# Patient Record
Sex: Female | Born: 1940 | Race: White | Hispanic: No | Marital: Married | Smoking: Former smoker
Health system: Southern US, Community
[De-identification: ages and names within clinical notes are randomized; demographics above are authoritative.]

## PROBLEM LIST (undated history)

## (undated) DIAGNOSIS — E079 Disorder of thyroid, unspecified: Secondary | ICD-10-CM

## (undated) DIAGNOSIS — D219 Benign neoplasm of connective and other soft tissue, unspecified: Secondary | ICD-10-CM

## (undated) DIAGNOSIS — I10 Essential (primary) hypertension: Secondary | ICD-10-CM

## (undated) HISTORY — PX: TONSILLECTOMY: SHX5217

## (undated) HISTORY — DX: Disorder of thyroid, unspecified: E07.9

## (undated) HISTORY — DX: Essential (primary) hypertension: I10

## (undated) HISTORY — DX: Benign neoplasm of connective and other soft tissue, unspecified: D21.9

## (undated) HISTORY — PX: HIP ARTHROPLASTY: SHX981

---

## 1999-06-29 ENCOUNTER — Other Ambulatory Visit: Admission: RE | Admit: 1999-06-29 | Discharge: 1999-06-29 | Payer: Self-pay | Admitting: Cardiology

## 2000-06-29 ENCOUNTER — Other Ambulatory Visit: Admission: RE | Admit: 2000-06-29 | Discharge: 2000-06-29 | Payer: Self-pay | Admitting: Internal Medicine

## 2001-02-28 ENCOUNTER — Encounter: Payer: Self-pay | Admitting: Orthopedic Surgery

## 2001-03-05 ENCOUNTER — Inpatient Hospital Stay (HOSPITAL_COMMUNITY): Admission: RE | Admit: 2001-03-05 | Discharge: 2001-03-07 | Payer: Self-pay | Admitting: Orthopedic Surgery

## 2001-11-23 ENCOUNTER — Other Ambulatory Visit: Admission: RE | Admit: 2001-11-23 | Discharge: 2001-11-23 | Payer: Self-pay | Admitting: Obstetrics and Gynecology

## 2003-01-10 ENCOUNTER — Other Ambulatory Visit: Admission: RE | Admit: 2003-01-10 | Discharge: 2003-01-10 | Payer: Self-pay | Admitting: Obstetrics and Gynecology

## 2004-01-15 ENCOUNTER — Other Ambulatory Visit: Admission: RE | Admit: 2004-01-15 | Discharge: 2004-01-15 | Payer: Self-pay | Admitting: *Deleted

## 2005-02-09 ENCOUNTER — Other Ambulatory Visit: Admission: RE | Admit: 2005-02-09 | Discharge: 2005-02-09 | Payer: Self-pay | Admitting: *Deleted

## 2005-08-09 ENCOUNTER — Encounter: Admission: RE | Admit: 2005-08-09 | Discharge: 2005-08-09 | Payer: Self-pay | Admitting: Orthopedic Surgery

## 2006-01-04 ENCOUNTER — Ambulatory Visit (HOSPITAL_COMMUNITY): Admission: RE | Admit: 2006-01-04 | Discharge: 2006-01-04 | Payer: Self-pay | Admitting: Family Medicine

## 2006-01-04 ENCOUNTER — Encounter: Payer: Self-pay | Admitting: Vascular Surgery

## 2006-04-17 ENCOUNTER — Other Ambulatory Visit: Admission: RE | Admit: 2006-04-17 | Discharge: 2006-04-17 | Payer: Self-pay | Admitting: Obstetrics & Gynecology

## 2006-09-04 ENCOUNTER — Inpatient Hospital Stay (HOSPITAL_COMMUNITY): Admission: RE | Admit: 2006-09-04 | Discharge: 2006-09-06 | Payer: Self-pay | Admitting: Orthopedic Surgery

## 2007-03-27 ENCOUNTER — Encounter: Payer: Self-pay | Admitting: Gastroenterology

## 2007-07-27 ENCOUNTER — Other Ambulatory Visit: Admission: RE | Admit: 2007-07-27 | Discharge: 2007-07-27 | Payer: Self-pay | Admitting: Obstetrics and Gynecology

## 2007-08-09 ENCOUNTER — Ambulatory Visit: Payer: Self-pay | Admitting: Internal Medicine

## 2007-08-23 ENCOUNTER — Ambulatory Visit: Payer: Self-pay | Admitting: Internal Medicine

## 2007-10-21 ENCOUNTER — Encounter: Admission: RE | Admit: 2007-10-21 | Discharge: 2007-10-21 | Payer: Self-pay | Admitting: Orthopedic Surgery

## 2010-08-19 NOTE — Procedures (Signed)
Summary: Gastroenterology EGD  Gastroenterology EGD   Imported By: Lowry Ram CMA 09/28/2007 13:36:31  _____________________________________________________________________  External Attachment:    Type:   Image     Comment:   External Document

## 2010-08-19 NOTE — Procedures (Signed)
Summary: Gastroenterology endo  Gastroenterology endo   Imported By: Donneta Romberg 09/28/2007 12:59:13  _____________________________________________________________________  External Attachment:    Type:   Image     Comment:   External Document

## 2010-12-03 NOTE — Discharge Summary (Signed)
Lauren Krueger, Lauren Krueger                ACCOUNT NO.:  [id number]   MEDICAL RECORD NO.:  [id number]          PATIENT TYPE:  INP   LOCATION:  5007                         FACILITY:  [facility]   PHYSICIAN:  Erskine Squibb B. Su Hilt, P.A.  DATE OF BIRTH:  1941/04/11   DATE OF ADMISSION:  09/04/2006  DATE OF DISCHARGE:  09/06/2006                               DISCHARGE SUMMARY   PRIMARY DIAGNOSIS:  End-stage DJD of the right hip.   PROCEDURE WHILE IN THE HOSPITAL:  Right total hip arthroplasty.   DISCHARGE SUMMARY:  Patient is a 70 year old woman who had a left total  hip done by Dr. Turner Daniels in 2002.  She has now developed end-stage  arthritis of the right hip.  Conservative treatment has consisted of  observation, antiinflammatory medication, physical therapy and a  diagnostic and therapeutic injection of cortisone into the hip that  provided excellent temporary relief and then the pain returned  immediately.  She also has had an MRI showing a large bursa on the  iliopsoas muscle.  Plain radiograph showed bone-on-bone arthritic  changes.  Risks and benefits of the surgery were discussed.  Questions  answered and she wishes to proceed with total hip arthroplasty on the  right.   ALLERGIES:  NO KNOWN DRUG ALLERGIES.   MEDICATIONS ON ADMISSION:  Hyzaar, Levoxyl, Celebrex and Prilosec.   PAST MEDICAL HISTORY:  Childhood diseases.  Adult history:  Hypertension, DJD and Graves' disease.   PAST SURGICAL HISTORY:  Left total hip arthroplasty in 2002,  tonsillectomy.  No difficulty with __________.   SOCIAL HISTORY:  No tobacco.  Positive ethanol.  Two to three drinks per  week.  No IV drug abuse.  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   FAMILY HISTORY:  Mother is alive at age 6 with a history of CVA and  DJD.  Father died at 59 with a history of CVA and hypertension.   REVIEW OF SYSTEMS:  Positive for glasses and decreased hearing.  She  denies any  recent illness, chest pain or shortness of breath.   PHYSICAL EXAMINATION:  Vital signs:  Temperature 97.1.  Pulse 84.  Respirations 16.  Blood pressure 137/87.  She is a 5 foot 1 inch, 62 kg  female.  HEENT:  Head:  Normocephalic, atraumatic.  Ears:  TMs clear.  Eyes:  Pupils equal, round and reactive to light and accommodation.  Nose:  Patent.  Throat:  Benign.  NECK:  Supple.  Full range of motion.  CHEST:  Clear to auscultation and percussion.  HEART:  Regular rate and rhythm.  ABDOMEN:  Bowel sounds 2+.  No masses.  Nontender.  EXTREMITIES:  Right hip range of motion, internal and external rotation  is 5 and 10 respectively with pain.  She has pain with forward flexion.  Foot tap is negative.  She is otherwise neurovascularly intact.  Has a  well-healed normal scar on the left hip.   X-rays show bone-on-bone changes of the right hip and a well-placed left  total hip.  Preoperative labs, including CBC, CMET, chest x-ray, EKG, PT and PTT are  all within normal limits, with the exception of platelet count of 415.  Sodium of 128, potassium 2.8 and chloride of 89.   HOSPITAL COURSE:  On day of admission, patient was taken to the  operating room at [facility] where she underwent a right total hip  using DePuy SROM components, a 40-mm ASR cup, NK +0 Ultima ball with a  43 mm size and a 16 x 11 x 150 x 36 stent and a 16 B large cone.  Foley  catheter was placed preoperatively.  Patient was placed on preoperative  antibiotics.  She was placed on postoperative Coumadin prophylaxis and  bridging Lovenox per pharmacy protocol.  She was placed on postoperative  PCA and Dilaudid for pain control.  Physical therapy was begun on the  first postoperative day.  Postoperative day #1:  The patient was awake,  alert with nausea and vomiting.  Vital signs were stable.  Wound was  clear and dry.  She was neurovascularly intact.  Urine output 800 mL.  Foley was discontinued.  Postoperative day  #2:  The patient stated she  wanted to go home and she had passed her physical therapy goals and felt  that she was ready.   PHYSICAL EXAMINATION:  VITAL SIGNS:  She was afebrile.  Vital signs  stable.  LABS:  Hemoglobin 9.1.  WBC 7.9.  INR 1.3.  EXTREMITIES:  Dressing with scant dried blood.  Wound was benign.  Thigh  with moderate edema.   She was otherwise stable and discharged home once PT goals were met.  Dressing changes daily. Tylox, Coumadin per pharmacy protocol x2 weeks.  Postoperative PT-INR 1.5 to 2.  Resume home meds as per home medications  direct sheet.  Diet is regular.  Maintain weight bearing as tolerating,  told of hip precautions bilaterally and use of walker.  Home health PT,  home health RN, with general medical equipment as needed.  Return to  clinic in approximately one week's time.  Should she have any increase  in temperature, pain that is not well controlled with pain medication or  drainage from the wound at the time of her discharge.  She was medically  stable and orthopedically improved.      Laural Benes. Jannet Mantis.     JBR/MEDQ  D:  10/30/2006  T:  10/30/2006  Job:  [id number]

## 2010-12-03 NOTE — Op Note (Signed)
Lauren Krueger, Lauren Krueger                ACCOUNT NO.:  [id number]   MEDICAL RECORD NO.:  [id number]          PATIENT TYPE:  INP   LOCATION:  NA                           FACILITY:  [facility]   PHYSICIAN:  Feliberto Gottron. Turner Daniels, M.D.   DATE OF BIRTH:  1941-03-25   DATE OF PROCEDURE:  09/04/2006  DATE OF DISCHARGE:                               OPERATIVE REPORT   PREOPERATIVE DIAGNOSIS:  End-stage arthritis right hip.   POSTOPERATIVE DIAGNOSIS:  End-stage arthritis right hip.   PROCEDURE:  Right total hip arthroplasty using DePuy components.  A 48  mm ASR cup, NK +0 Ultamet ball 43 mm in size, a 16 x 11 x 36 stem and a  16D large cone.   SURGEON:  Feliberto Gottron. Turner Daniels, M.D.   FIRST ASSISTANT:  Skip Mayer PA-C.   ANESTHETIC:  Endotracheal.   ESTIMATED BLOOD LOSS:  300 mL.   FLUID REPLACEMENT:  Liter crystalloid.   DRAINS PLACED:  Foley catheter.   URINE OUTPUT:  300 mL.   INDICATIONS FOR PROCEDURE:  70 year old woman who had a left total hip  done by me back in 2002 and now has end-stage arthritis of the right  hip.  Conservative treatment has consisted of observation, anti-  inflammatory medicine, physical therapy and a diagnostic and therapeutic  injection of cortisone into the hip joint that provided excellent  temporary relief, then the pain returned immediately.  She also had MRI  scan showing a large bursa on the iliopsoas muscle.  Plain radiographs  showed bone-on-bone arthritic changes.  Risks and benefits of surgery  discussed, questions answered.   DESCRIPTION OF PROCEDURE:  The patient identified by armband, taken the  operating room at [facility] main hospital where the appropriate anesthetic  monitors were attached and general endotracheal anesthesia induced after  she received a gram of Ancef IV.  Foley catheter was inserted.  She was  rolled into the left lateral decubitus position, fixed there with a  Stulberg Mark II pelvic clamp and the right lower extremity prepped and  draped  in usual sterile fashion from the ankle to the hemipelvis.  The  skin along the lateral hip and thigh infiltrated with 20 mL of half  percent Marcaine and epinephrine solution and then a posterolateral  approach to the hip was made utilizing a 18 cm skin incision centered  over the greater trochanter.  Small bleeders in the skin, subcutaneous  tissue identified and cauterized.  The IT band was then cut in line with  the skin incision exposing the greater trochanter.  A Cobra retractor  was placed between the gluteus minimus and superior hip joint capsule.  A second between the quadratus femoris and the inferior joint capsule.  This exposed piriformis and short external rotators which were then  tagged with a #2 Ethibond suture and cut off their insertion on the  intertrochanteric crest exposing the posterior aspect of hip joint  capsule which was then developed into an acetabular based flap and  likewise tagged to #2 Ethibond sutures.  This exposed the arthritic  femoral head which was down to  bare bone along the sorceil region.  The  hip was dislocated and a standard neck cut was performed one  fingerbreadth above the lesser trochanter.  We then translated the  proximal femur anteriorly, levering off the anterior column of the  acetabulum and placed posterior-superior and posterior-inferior wing  retractors to enhance our acetabular exposure and finally a spike Cobra  in the cotyloid notch.  This allowed removal of the labrum and  sequential reaming of the acetabulum up to a 47 mm basket reamer  obtaining good coverage in all quadrants and getting just into the  subchondral bone.  The acetabulum was then irrigated out normal saline  solution and a 48 mm ASR cup was then hammered into place and 15-20  degrees of anteversion, 45 degrees of abduction. Satisfied with the fit  of the ASR cup, we then flexed and internally rotated the thigh, again  exposing the proximal femur which was entered  with the initiating reamer  followed by axial reaming up to 11.5 cylindrical reamer from the as SROM  set to the appropriate depth for the stem.  We then conically reamed up  to a 16D cone reaming to the depth for a 36 neck.  We then milled the  calcar to a 16 D large calcar and a 16 D large trial cone was hammered  into the proximal femur followed by trial stem we then an NK +0 43 mm  ball for the 48 mm cup.  Hip was then reduced and she could not be  dislocated in extension or external rotation and flexion, you could take  her to 90 and internally rotate almost 60 degrees before there was any  hint of instability.  At this point the trial components were removed.  A 16 D large ZTT1 cone was hammered into place, we check reamed with the  11.5 reamer, one more time and then hammered in the 16 x 11 x 36 stem in  the same version as the calcar.  An NK +0 43 Ultamet ball was then  hammered onto the SROM stem and the hip reduced. Stability was noted be  excellent.  The wound was irrigated out with normal saline solution.  The short external rotators and piriformis were repaired back to the  intertrochanteric crest through drill holes.  The IT band was closed  with running #1 Vicryl suture.  The subcutaneous tissue 0-0 and 2-0  undyed Vicryl suture and the skin with running interlocking 3-0 nylon  suture.  A dressing of Xeroform 4x4 dressing sponges was then applied.  The patient was unclamped, rolled supine, awakened and taken to recovery  room without difficulty.      Feliberto Gottron. Turner Daniels, M.D.  Electronically Signed     FJR/MEDQ  D:  09/04/2006  T:  09/04/2006  Job:  [id number]

## 2010-12-03 NOTE — Op Note (Signed)
[facility]  Patient:    Lauren Krueger, Lauren Krueger Visit Number: [id number] MRN: [id number]          Service Type: SUR Location: [id number] Attending Physician:  Alinda Deem Proc. Date: 03/05/01 Adm. Date:  03/05/2001                             Operative Report  PREOPERATIVE DIAGNOSIS:  Degenerative arthritis of the left hip.  POSTOPERATIVE DIAGNOSIS:  Degenerative arthritis of the left hip.  OPERATION PERFORMED:  Left total hip arthroplasty, uncemented using the SROM system from Depuy on the femoral side and a 50 mm Duraloc cup on the acetabular side.  SURGEON:  Alinda Deem, M.D.  ASSISTANT:  Dorthula Matas, P.A.-C.  ANESTHESIA:  General endotracheal.  ESTIMATED BLOOD LOSS:  250 cc.  DRAINS:  None.  TOURNIQUET TIME:  None.  FLUID REPLACEMENT:  1200 cc of crystalloid.  INDICATIONS FOR PROCEDURE:  The patient is a 70 year old woman with end-stage arthritis of the left hip down to bone-on-bone by x-ray.  She was limping, has pain that wakes her at night and prevents activities of daily living.  She has failed conservative treatment with anti-inflammatory medicines, physical therapy, up to an including yoga classes as well as pool therapy.  She desires elective left total hip arthroplasty to decrease pain and increase function.  DESCRIPTION OF PROCEDURE:  The patient was identified by arm band and taken to the operating room at [facility] main hospital where the appropriate anesthetic monitors were attached and general endotracheal anesthesia induced with the patient in the supine position.  She was then rolled into the right lateral decubitus position and fixed there with a Stulberg Mark II pelvic clamp and the left lower extremity prepped and draped in the usual sterile fashion from the ankle to the hemipelvis.  The skin along the lateral hip and thigh was infiltrated with 20 cc of 0.5% Marcaine with epinephrine solution and a 15  cm incision centered over the greater trochanter was made allowing a posterolateral approach to the hip joint through the skin and subcutaneous tissues.  Small bleeders were identified and cauterized with electrocautery. The iliotibial band was cut in line with the skin incision exposing the greater trochanter, short external rotators and gluteus medius.  A Cobra retractor was placed between the gluteus minimus and the superior hip joint capsule and a second Cobra retractor between the quadratus femoris and the inferior hip joint capsule.  The piriformis and short external rotators were tagged with with two #2 Ethibond sutures and cut off at their insertion on the intertrochanteric crest exposing the posterior aspect of the hip joint capsule which was developed into an acetabular based flap from superior to inferior. This was likewise tagged with two #2 Ethibond sutures.  The hip was flexed and internally rotated dislocating the arthritic femoral head which was down to bare bone and the weightbearing region.  One fingerbreadth above the lesser trochanter, a standard neck cut was made removing the femoral head and exposing the acetabulum which had similar arthritic changes.  A posterior superior and a posterior inferior wing retractor were placed.  The proximal femur was levered anteriorly off the anterior column with a Hohmann retractor and a cobra retractor was placed in the cotyloid notch.  This allowed removal of the labrum and the pulvinar and allowed [place] to sequentially ream the acetabulum up to a 49 mm basket reamer obtaining  good subchondral bone bite. A 50 mm Duraloc beaded cup was then brought up to the field and hammered into place in 45 degrees of abduction, 30 degrees of anteversion and a central occluder placed.  Excellent fit and fill of the socket was made with the inserter in place.  It could not be loosened.  At this point the hip was flexed and internally rotated exposing  the proximal femur which was entered with a box cutting chisel followed by axial reaming up to 10.5 mm distally and 11 mm proximally followed by conical reaming up to a 16D cone and followed by calcar milling with the calcar milling device.  A 16 D large trial cone was hammered into place followed by a 16 x 11 trial stem with an NK+36 neck and a 0 ball.  The hip was reduced, stability to 90 of flexion, 60 of internal rotation as well as full extension and 40 of external rotation was noted.  At this point the trial components were removed, a 10 degree liner to accept a 28 mm ball was hammered into the socket with the index marked posterior and superior.  A 16D large ZTT cone was hammered into the proximal femur followed by a 16 x 11 x 150 stem with an NK+0 ball on a 36 neck.  The hip was once again reduced, stability was noted to be excellent.  The wound was washed out with normal saline solution.  The intertrochanteric crest was prepared with 3/32 inch drill holes x 4 with repair of the capsule and short external rotators.  The iliotibial band was then closed with running #1 Vicryl suture, the subcutaneous tissue with running 0 and 2-0 undyed Vicryl sutures and the skin with running interlocking 3-0 nylon suture.  A dressing of Xeroform, 4 x 4 dressing sponges, and Hypafix tape applied.  The patient was rolled supine, awakened and taken to the recovery room without difficulty. Attending Physician:  Alinda Deem DD:  03/05/01 TD:  03/06/01 Job: [id number]

## 2011-06-14 ENCOUNTER — Other Ambulatory Visit: Payer: Self-pay | Admitting: Medical Oncology

## 2011-06-14 NOTE — Telephone Encounter (Signed)
Opened wrong pt chart.

## 2011-07-28 DIAGNOSIS — Z1231 Encounter for screening mammogram for malignant neoplasm of breast: Secondary | ICD-10-CM | POA: Diagnosis not present

## 2011-08-08 DIAGNOSIS — Z79899 Other long term (current) drug therapy: Secondary | ICD-10-CM | POA: Diagnosis not present

## 2011-08-08 DIAGNOSIS — M899 Disorder of bone, unspecified: Secondary | ICD-10-CM | POA: Diagnosis not present

## 2011-08-08 DIAGNOSIS — E039 Hypothyroidism, unspecified: Secondary | ICD-10-CM | POA: Diagnosis not present

## 2011-08-08 DIAGNOSIS — R82998 Other abnormal findings in urine: Secondary | ICD-10-CM | POA: Diagnosis not present

## 2011-08-08 DIAGNOSIS — E785 Hyperlipidemia, unspecified: Secondary | ICD-10-CM | POA: Diagnosis not present

## 2011-08-08 DIAGNOSIS — M949 Disorder of cartilage, unspecified: Secondary | ICD-10-CM | POA: Diagnosis not present

## 2011-08-08 DIAGNOSIS — I10 Essential (primary) hypertension: Secondary | ICD-10-CM | POA: Diagnosis not present

## 2011-08-08 DIAGNOSIS — E559 Vitamin D deficiency, unspecified: Secondary | ICD-10-CM | POA: Diagnosis not present

## 2011-08-15 DIAGNOSIS — Z124 Encounter for screening for malignant neoplasm of cervix: Secondary | ICD-10-CM | POA: Diagnosis not present

## 2011-08-15 DIAGNOSIS — Z79899 Other long term (current) drug therapy: Secondary | ICD-10-CM | POA: Diagnosis not present

## 2011-08-15 DIAGNOSIS — E785 Hyperlipidemia, unspecified: Secondary | ICD-10-CM | POA: Diagnosis not present

## 2011-08-15 DIAGNOSIS — H6120 Impacted cerumen, unspecified ear: Secondary | ICD-10-CM | POA: Diagnosis not present

## 2011-08-15 DIAGNOSIS — I10 Essential (primary) hypertension: Secondary | ICD-10-CM | POA: Diagnosis not present

## 2011-08-15 DIAGNOSIS — Z0000 Encounter for general adult medical examination without abnormal findings: Secondary | ICD-10-CM | POA: Diagnosis not present

## 2011-08-17 DIAGNOSIS — Z1212 Encounter for screening for malignant neoplasm of rectum: Secondary | ICD-10-CM | POA: Diagnosis not present

## 2011-08-24 DIAGNOSIS — M899 Disorder of bone, unspecified: Secondary | ICD-10-CM | POA: Diagnosis not present

## 2011-08-24 DIAGNOSIS — M949 Disorder of cartilage, unspecified: Secondary | ICD-10-CM | POA: Diagnosis not present

## 2011-08-26 DIAGNOSIS — H35379 Puckering of macula, unspecified eye: Secondary | ICD-10-CM | POA: Diagnosis not present

## 2011-08-26 DIAGNOSIS — H259 Unspecified age-related cataract: Secondary | ICD-10-CM | POA: Diagnosis not present

## 2011-09-13 DIAGNOSIS — H25019 Cortical age-related cataract, unspecified eye: Secondary | ICD-10-CM | POA: Diagnosis not present

## 2011-09-13 DIAGNOSIS — H52209 Unspecified astigmatism, unspecified eye: Secondary | ICD-10-CM | POA: Diagnosis not present

## 2011-09-13 DIAGNOSIS — H2510 Age-related nuclear cataract, unspecified eye: Secondary | ICD-10-CM | POA: Diagnosis not present

## 2011-09-13 DIAGNOSIS — H269 Unspecified cataract: Secondary | ICD-10-CM | POA: Diagnosis not present

## 2011-09-15 DIAGNOSIS — I10 Essential (primary) hypertension: Secondary | ICD-10-CM | POA: Diagnosis not present

## 2011-09-15 DIAGNOSIS — J40 Bronchitis, not specified as acute or chronic: Secondary | ICD-10-CM | POA: Diagnosis not present

## 2011-09-15 DIAGNOSIS — K219 Gastro-esophageal reflux disease without esophagitis: Secondary | ICD-10-CM | POA: Diagnosis not present

## 2011-10-11 DIAGNOSIS — H2510 Age-related nuclear cataract, unspecified eye: Secondary | ICD-10-CM | POA: Diagnosis not present

## 2011-10-11 DIAGNOSIS — H269 Unspecified cataract: Secondary | ICD-10-CM | POA: Diagnosis not present

## 2011-10-11 DIAGNOSIS — H52209 Unspecified astigmatism, unspecified eye: Secondary | ICD-10-CM | POA: Diagnosis not present

## 2011-10-11 DIAGNOSIS — H25019 Cortical age-related cataract, unspecified eye: Secondary | ICD-10-CM | POA: Diagnosis not present

## 2011-10-24 DIAGNOSIS — IMO0002 Reserved for concepts with insufficient information to code with codable children: Secondary | ICD-10-CM | POA: Diagnosis not present

## 2011-10-24 DIAGNOSIS — Q762 Congenital spondylolisthesis: Secondary | ICD-10-CM | POA: Diagnosis not present

## 2011-10-24 DIAGNOSIS — M999 Biomechanical lesion, unspecified: Secondary | ICD-10-CM | POA: Diagnosis not present

## 2011-10-25 DIAGNOSIS — M169 Osteoarthritis of hip, unspecified: Secondary | ICD-10-CM | POA: Diagnosis not present

## 2011-10-25 DIAGNOSIS — M1610 Unilateral primary osteoarthritis, unspecified hip: Secondary | ICD-10-CM | POA: Diagnosis not present

## 2011-10-31 DIAGNOSIS — Q762 Congenital spondylolisthesis: Secondary | ICD-10-CM | POA: Diagnosis not present

## 2011-10-31 DIAGNOSIS — IMO0002 Reserved for concepts with insufficient information to code with codable children: Secondary | ICD-10-CM | POA: Diagnosis not present

## 2011-10-31 DIAGNOSIS — M999 Biomechanical lesion, unspecified: Secondary | ICD-10-CM | POA: Diagnosis not present

## 2011-11-01 DIAGNOSIS — IMO0002 Reserved for concepts with insufficient information to code with codable children: Secondary | ICD-10-CM | POA: Diagnosis not present

## 2011-11-01 DIAGNOSIS — Q762 Congenital spondylolisthesis: Secondary | ICD-10-CM | POA: Diagnosis not present

## 2011-11-01 DIAGNOSIS — M999 Biomechanical lesion, unspecified: Secondary | ICD-10-CM | POA: Diagnosis not present

## 2011-11-07 DIAGNOSIS — Q762 Congenital spondylolisthesis: Secondary | ICD-10-CM | POA: Diagnosis not present

## 2011-11-07 DIAGNOSIS — IMO0002 Reserved for concepts with insufficient information to code with codable children: Secondary | ICD-10-CM | POA: Diagnosis not present

## 2011-11-07 DIAGNOSIS — M999 Biomechanical lesion, unspecified: Secondary | ICD-10-CM | POA: Diagnosis not present

## 2011-11-10 DIAGNOSIS — IMO0002 Reserved for concepts with insufficient information to code with codable children: Secondary | ICD-10-CM | POA: Diagnosis not present

## 2011-11-10 DIAGNOSIS — M999 Biomechanical lesion, unspecified: Secondary | ICD-10-CM | POA: Diagnosis not present

## 2011-11-10 DIAGNOSIS — Q762 Congenital spondylolisthesis: Secondary | ICD-10-CM | POA: Diagnosis not present

## 2011-11-15 DIAGNOSIS — M999 Biomechanical lesion, unspecified: Secondary | ICD-10-CM | POA: Diagnosis not present

## 2011-11-15 DIAGNOSIS — IMO0002 Reserved for concepts with insufficient information to code with codable children: Secondary | ICD-10-CM | POA: Diagnosis not present

## 2011-11-15 DIAGNOSIS — Q762 Congenital spondylolisthesis: Secondary | ICD-10-CM | POA: Diagnosis not present

## 2011-11-17 DIAGNOSIS — Q762 Congenital spondylolisthesis: Secondary | ICD-10-CM | POA: Diagnosis not present

## 2011-11-17 DIAGNOSIS — M999 Biomechanical lesion, unspecified: Secondary | ICD-10-CM | POA: Diagnosis not present

## 2011-11-17 DIAGNOSIS — IMO0002 Reserved for concepts with insufficient information to code with codable children: Secondary | ICD-10-CM | POA: Diagnosis not present

## 2011-11-24 DIAGNOSIS — Q762 Congenital spondylolisthesis: Secondary | ICD-10-CM | POA: Diagnosis not present

## 2011-11-24 DIAGNOSIS — M999 Biomechanical lesion, unspecified: Secondary | ICD-10-CM | POA: Diagnosis not present

## 2011-11-24 DIAGNOSIS — IMO0002 Reserved for concepts with insufficient information to code with codable children: Secondary | ICD-10-CM | POA: Diagnosis not present

## 2011-11-29 DIAGNOSIS — Q762 Congenital spondylolisthesis: Secondary | ICD-10-CM | POA: Diagnosis not present

## 2011-11-29 DIAGNOSIS — M999 Biomechanical lesion, unspecified: Secondary | ICD-10-CM | POA: Diagnosis not present

## 2011-11-29 DIAGNOSIS — IMO0002 Reserved for concepts with insufficient information to code with codable children: Secondary | ICD-10-CM | POA: Diagnosis not present

## 2011-12-01 DIAGNOSIS — IMO0002 Reserved for concepts with insufficient information to code with codable children: Secondary | ICD-10-CM | POA: Diagnosis not present

## 2011-12-01 DIAGNOSIS — Q762 Congenital spondylolisthesis: Secondary | ICD-10-CM | POA: Diagnosis not present

## 2011-12-01 DIAGNOSIS — M999 Biomechanical lesion, unspecified: Secondary | ICD-10-CM | POA: Diagnosis not present

## 2011-12-05 DIAGNOSIS — IMO0002 Reserved for concepts with insufficient information to code with codable children: Secondary | ICD-10-CM | POA: Diagnosis not present

## 2011-12-05 DIAGNOSIS — Q762 Congenital spondylolisthesis: Secondary | ICD-10-CM | POA: Diagnosis not present

## 2011-12-05 DIAGNOSIS — M999 Biomechanical lesion, unspecified: Secondary | ICD-10-CM | POA: Diagnosis not present

## 2011-12-06 DIAGNOSIS — Z23 Encounter for immunization: Secondary | ICD-10-CM | POA: Diagnosis not present

## 2011-12-07 DIAGNOSIS — Q762 Congenital spondylolisthesis: Secondary | ICD-10-CM | POA: Diagnosis not present

## 2011-12-07 DIAGNOSIS — IMO0002 Reserved for concepts with insufficient information to code with codable children: Secondary | ICD-10-CM | POA: Diagnosis not present

## 2011-12-07 DIAGNOSIS — M999 Biomechanical lesion, unspecified: Secondary | ICD-10-CM | POA: Diagnosis not present

## 2011-12-13 DIAGNOSIS — IMO0002 Reserved for concepts with insufficient information to code with codable children: Secondary | ICD-10-CM | POA: Diagnosis not present

## 2011-12-13 DIAGNOSIS — M999 Biomechanical lesion, unspecified: Secondary | ICD-10-CM | POA: Diagnosis not present

## 2011-12-13 DIAGNOSIS — Q762 Congenital spondylolisthesis: Secondary | ICD-10-CM | POA: Diagnosis not present

## 2011-12-15 DIAGNOSIS — M999 Biomechanical lesion, unspecified: Secondary | ICD-10-CM | POA: Diagnosis not present

## 2011-12-15 DIAGNOSIS — R300 Dysuria: Secondary | ICD-10-CM | POA: Diagnosis not present

## 2011-12-15 DIAGNOSIS — IMO0002 Reserved for concepts with insufficient information to code with codable children: Secondary | ICD-10-CM | POA: Diagnosis not present

## 2011-12-15 DIAGNOSIS — Q762 Congenital spondylolisthesis: Secondary | ICD-10-CM | POA: Diagnosis not present

## 2012-01-04 DIAGNOSIS — M999 Biomechanical lesion, unspecified: Secondary | ICD-10-CM | POA: Diagnosis not present

## 2012-01-04 DIAGNOSIS — IMO0002 Reserved for concepts with insufficient information to code with codable children: Secondary | ICD-10-CM | POA: Diagnosis not present

## 2012-01-04 DIAGNOSIS — Q762 Congenital spondylolisthesis: Secondary | ICD-10-CM | POA: Diagnosis not present

## 2012-05-24 DIAGNOSIS — IMO0002 Reserved for concepts with insufficient information to code with codable children: Secondary | ICD-10-CM | POA: Diagnosis not present

## 2012-05-24 DIAGNOSIS — Q762 Congenital spondylolisthesis: Secondary | ICD-10-CM | POA: Diagnosis not present

## 2012-05-24 DIAGNOSIS — M999 Biomechanical lesion, unspecified: Secondary | ICD-10-CM | POA: Diagnosis not present

## 2012-05-29 DIAGNOSIS — Z23 Encounter for immunization: Secondary | ICD-10-CM | POA: Diagnosis not present

## 2012-06-21 DIAGNOSIS — Q762 Congenital spondylolisthesis: Secondary | ICD-10-CM | POA: Diagnosis not present

## 2012-06-21 DIAGNOSIS — IMO0002 Reserved for concepts with insufficient information to code with codable children: Secondary | ICD-10-CM | POA: Diagnosis not present

## 2012-06-21 DIAGNOSIS — M999 Biomechanical lesion, unspecified: Secondary | ICD-10-CM | POA: Diagnosis not present

## 2012-07-30 DIAGNOSIS — Z1231 Encounter for screening mammogram for malignant neoplasm of breast: Secondary | ICD-10-CM | POA: Diagnosis not present

## 2012-08-20 DIAGNOSIS — D2330 Other benign neoplasm of skin of unspecified part of face: Secondary | ICD-10-CM | POA: Diagnosis not present

## 2012-08-20 DIAGNOSIS — L820 Inflamed seborrheic keratosis: Secondary | ICD-10-CM | POA: Diagnosis not present

## 2012-08-20 DIAGNOSIS — D485 Neoplasm of uncertain behavior of skin: Secondary | ICD-10-CM | POA: Diagnosis not present

## 2012-08-20 DIAGNOSIS — L821 Other seborrheic keratosis: Secondary | ICD-10-CM | POA: Diagnosis not present

## 2012-08-28 DIAGNOSIS — IMO0002 Reserved for concepts with insufficient information to code with codable children: Secondary | ICD-10-CM | POA: Diagnosis not present

## 2012-08-28 DIAGNOSIS — Q762 Congenital spondylolisthesis: Secondary | ICD-10-CM | POA: Diagnosis not present

## 2012-08-28 DIAGNOSIS — M999 Biomechanical lesion, unspecified: Secondary | ICD-10-CM | POA: Diagnosis not present

## 2012-09-03 DIAGNOSIS — M999 Biomechanical lesion, unspecified: Secondary | ICD-10-CM | POA: Diagnosis not present

## 2012-09-03 DIAGNOSIS — IMO0002 Reserved for concepts with insufficient information to code with codable children: Secondary | ICD-10-CM | POA: Diagnosis not present

## 2012-09-03 DIAGNOSIS — Q762 Congenital spondylolisthesis: Secondary | ICD-10-CM | POA: Diagnosis not present

## 2012-09-04 DIAGNOSIS — M999 Biomechanical lesion, unspecified: Secondary | ICD-10-CM | POA: Diagnosis not present

## 2012-09-04 DIAGNOSIS — IMO0002 Reserved for concepts with insufficient information to code with codable children: Secondary | ICD-10-CM | POA: Diagnosis not present

## 2012-09-04 DIAGNOSIS — Q762 Congenital spondylolisthesis: Secondary | ICD-10-CM | POA: Diagnosis not present

## 2012-09-06 DIAGNOSIS — Q762 Congenital spondylolisthesis: Secondary | ICD-10-CM | POA: Diagnosis not present

## 2012-09-06 DIAGNOSIS — M999 Biomechanical lesion, unspecified: Secondary | ICD-10-CM | POA: Diagnosis not present

## 2012-09-06 DIAGNOSIS — IMO0002 Reserved for concepts with insufficient information to code with codable children: Secondary | ICD-10-CM | POA: Diagnosis not present

## 2012-09-10 DIAGNOSIS — IMO0002 Reserved for concepts with insufficient information to code with codable children: Secondary | ICD-10-CM | POA: Diagnosis not present

## 2012-09-10 DIAGNOSIS — Q762 Congenital spondylolisthesis: Secondary | ICD-10-CM | POA: Diagnosis not present

## 2012-09-10 DIAGNOSIS — M999 Biomechanical lesion, unspecified: Secondary | ICD-10-CM | POA: Diagnosis not present

## 2012-09-13 DIAGNOSIS — M999 Biomechanical lesion, unspecified: Secondary | ICD-10-CM | POA: Diagnosis not present

## 2012-09-13 DIAGNOSIS — M9981 Other biomechanical lesions of cervical region: Secondary | ICD-10-CM | POA: Diagnosis not present

## 2012-09-13 DIAGNOSIS — Q762 Congenital spondylolisthesis: Secondary | ICD-10-CM | POA: Diagnosis not present

## 2012-09-13 DIAGNOSIS — M5030 Other cervical disc degeneration, unspecified cervical region: Secondary | ICD-10-CM | POA: Diagnosis not present

## 2012-09-14 DIAGNOSIS — M9981 Other biomechanical lesions of cervical region: Secondary | ICD-10-CM | POA: Diagnosis not present

## 2012-09-14 DIAGNOSIS — M5030 Other cervical disc degeneration, unspecified cervical region: Secondary | ICD-10-CM | POA: Diagnosis not present

## 2012-09-14 DIAGNOSIS — M999 Biomechanical lesion, unspecified: Secondary | ICD-10-CM | POA: Diagnosis not present

## 2012-09-14 DIAGNOSIS — Q762 Congenital spondylolisthesis: Secondary | ICD-10-CM | POA: Diagnosis not present

## 2012-09-18 DIAGNOSIS — Q762 Congenital spondylolisthesis: Secondary | ICD-10-CM | POA: Diagnosis not present

## 2012-09-18 DIAGNOSIS — M5030 Other cervical disc degeneration, unspecified cervical region: Secondary | ICD-10-CM | POA: Diagnosis not present

## 2012-09-18 DIAGNOSIS — M9981 Other biomechanical lesions of cervical region: Secondary | ICD-10-CM | POA: Diagnosis not present

## 2012-09-18 DIAGNOSIS — M999 Biomechanical lesion, unspecified: Secondary | ICD-10-CM | POA: Diagnosis not present

## 2012-10-05 DIAGNOSIS — Z961 Presence of intraocular lens: Secondary | ICD-10-CM | POA: Diagnosis not present

## 2012-10-05 DIAGNOSIS — H2640 Unspecified secondary cataract: Secondary | ICD-10-CM | POA: Diagnosis not present

## 2012-10-05 DIAGNOSIS — H52209 Unspecified astigmatism, unspecified eye: Secondary | ICD-10-CM | POA: Diagnosis not present

## 2012-10-09 DIAGNOSIS — Q762 Congenital spondylolisthesis: Secondary | ICD-10-CM | POA: Diagnosis not present

## 2012-10-09 DIAGNOSIS — M999 Biomechanical lesion, unspecified: Secondary | ICD-10-CM | POA: Diagnosis not present

## 2012-10-09 DIAGNOSIS — M9981 Other biomechanical lesions of cervical region: Secondary | ICD-10-CM | POA: Diagnosis not present

## 2012-10-09 DIAGNOSIS — M5030 Other cervical disc degeneration, unspecified cervical region: Secondary | ICD-10-CM | POA: Diagnosis not present

## 2012-10-09 DIAGNOSIS — IMO0002 Reserved for concepts with insufficient information to code with codable children: Secondary | ICD-10-CM | POA: Diagnosis not present

## 2012-10-11 DIAGNOSIS — H2640 Unspecified secondary cataract: Secondary | ICD-10-CM | POA: Diagnosis not present

## 2012-10-11 DIAGNOSIS — H26499 Other secondary cataract, unspecified eye: Secondary | ICD-10-CM | POA: Diagnosis not present

## 2012-10-18 DIAGNOSIS — H2640 Unspecified secondary cataract: Secondary | ICD-10-CM | POA: Diagnosis not present

## 2012-10-18 DIAGNOSIS — H26499 Other secondary cataract, unspecified eye: Secondary | ICD-10-CM | POA: Diagnosis not present

## 2012-10-19 DIAGNOSIS — M949 Disorder of cartilage, unspecified: Secondary | ICD-10-CM | POA: Diagnosis not present

## 2012-10-19 DIAGNOSIS — R82998 Other abnormal findings in urine: Secondary | ICD-10-CM | POA: Diagnosis not present

## 2012-10-19 DIAGNOSIS — E559 Vitamin D deficiency, unspecified: Secondary | ICD-10-CM | POA: Diagnosis not present

## 2012-10-19 DIAGNOSIS — M899 Disorder of bone, unspecified: Secondary | ICD-10-CM | POA: Diagnosis not present

## 2012-10-19 DIAGNOSIS — I10 Essential (primary) hypertension: Secondary | ICD-10-CM | POA: Diagnosis not present

## 2012-10-19 DIAGNOSIS — E785 Hyperlipidemia, unspecified: Secondary | ICD-10-CM | POA: Diagnosis not present

## 2012-10-19 DIAGNOSIS — E039 Hypothyroidism, unspecified: Secondary | ICD-10-CM | POA: Diagnosis not present

## 2012-10-22 DIAGNOSIS — M999 Biomechanical lesion, unspecified: Secondary | ICD-10-CM | POA: Diagnosis not present

## 2012-10-22 DIAGNOSIS — M9981 Other biomechanical lesions of cervical region: Secondary | ICD-10-CM | POA: Diagnosis not present

## 2012-10-22 DIAGNOSIS — IMO0002 Reserved for concepts with insufficient information to code with codable children: Secondary | ICD-10-CM | POA: Diagnosis not present

## 2012-10-22 DIAGNOSIS — M5030 Other cervical disc degeneration, unspecified cervical region: Secondary | ICD-10-CM | POA: Diagnosis not present

## 2012-10-22 DIAGNOSIS — E871 Hypo-osmolality and hyponatremia: Secondary | ICD-10-CM | POA: Diagnosis not present

## 2012-10-22 DIAGNOSIS — Q762 Congenital spondylolisthesis: Secondary | ICD-10-CM | POA: Diagnosis not present

## 2012-10-25 DIAGNOSIS — M7750 Other enthesopathy of unspecified foot: Secondary | ICD-10-CM | POA: Diagnosis not present

## 2012-10-25 DIAGNOSIS — M25559 Pain in unspecified hip: Secondary | ICD-10-CM | POA: Diagnosis not present

## 2012-10-26 DIAGNOSIS — I10 Essential (primary) hypertension: Secondary | ICD-10-CM | POA: Diagnosis not present

## 2012-10-26 DIAGNOSIS — Z124 Encounter for screening for malignant neoplasm of cervix: Secondary | ICD-10-CM | POA: Diagnosis not present

## 2012-10-26 DIAGNOSIS — Z1331 Encounter for screening for depression: Secondary | ICD-10-CM | POA: Diagnosis not present

## 2012-10-26 DIAGNOSIS — IMO0002 Reserved for concepts with insufficient information to code with codable children: Secondary | ICD-10-CM | POA: Diagnosis not present

## 2012-10-26 DIAGNOSIS — E871 Hypo-osmolality and hyponatremia: Secondary | ICD-10-CM | POA: Diagnosis not present

## 2012-10-26 DIAGNOSIS — E039 Hypothyroidism, unspecified: Secondary | ICD-10-CM | POA: Diagnosis not present

## 2012-10-26 DIAGNOSIS — R82998 Other abnormal findings in urine: Secondary | ICD-10-CM | POA: Diagnosis not present

## 2012-10-26 DIAGNOSIS — Z23 Encounter for immunization: Secondary | ICD-10-CM | POA: Diagnosis not present

## 2012-10-26 DIAGNOSIS — E785 Hyperlipidemia, unspecified: Secondary | ICD-10-CM | POA: Diagnosis not present

## 2012-10-26 DIAGNOSIS — Z0000 Encounter for general adult medical examination without abnormal findings: Secondary | ICD-10-CM | POA: Diagnosis not present

## 2012-10-29 DIAGNOSIS — M9981 Other biomechanical lesions of cervical region: Secondary | ICD-10-CM | POA: Diagnosis not present

## 2012-10-29 DIAGNOSIS — M999 Biomechanical lesion, unspecified: Secondary | ICD-10-CM | POA: Diagnosis not present

## 2012-10-29 DIAGNOSIS — IMO0002 Reserved for concepts with insufficient information to code with codable children: Secondary | ICD-10-CM | POA: Diagnosis not present

## 2012-10-29 DIAGNOSIS — Q762 Congenital spondylolisthesis: Secondary | ICD-10-CM | POA: Diagnosis not present

## 2012-10-29 DIAGNOSIS — M5030 Other cervical disc degeneration, unspecified cervical region: Secondary | ICD-10-CM | POA: Diagnosis not present

## 2012-11-05 DIAGNOSIS — M999 Biomechanical lesion, unspecified: Secondary | ICD-10-CM | POA: Diagnosis not present

## 2012-11-05 DIAGNOSIS — IMO0002 Reserved for concepts with insufficient information to code with codable children: Secondary | ICD-10-CM | POA: Diagnosis not present

## 2012-11-05 DIAGNOSIS — Q762 Congenital spondylolisthesis: Secondary | ICD-10-CM | POA: Diagnosis not present

## 2012-11-05 DIAGNOSIS — M5030 Other cervical disc degeneration, unspecified cervical region: Secondary | ICD-10-CM | POA: Diagnosis not present

## 2012-11-05 DIAGNOSIS — M9981 Other biomechanical lesions of cervical region: Secondary | ICD-10-CM | POA: Diagnosis not present

## 2012-11-07 DIAGNOSIS — Z1212 Encounter for screening for malignant neoplasm of rectum: Secondary | ICD-10-CM | POA: Diagnosis not present

## 2012-11-13 DIAGNOSIS — IMO0002 Reserved for concepts with insufficient information to code with codable children: Secondary | ICD-10-CM | POA: Diagnosis not present

## 2012-11-13 DIAGNOSIS — M9981 Other biomechanical lesions of cervical region: Secondary | ICD-10-CM | POA: Diagnosis not present

## 2012-11-13 DIAGNOSIS — M5030 Other cervical disc degeneration, unspecified cervical region: Secondary | ICD-10-CM | POA: Diagnosis not present

## 2012-11-13 DIAGNOSIS — M25579 Pain in unspecified ankle and joints of unspecified foot: Secondary | ICD-10-CM | POA: Diagnosis not present

## 2012-11-13 DIAGNOSIS — M999 Biomechanical lesion, unspecified: Secondary | ICD-10-CM | POA: Diagnosis not present

## 2012-11-13 DIAGNOSIS — Q762 Congenital spondylolisthesis: Secondary | ICD-10-CM | POA: Diagnosis not present

## 2012-12-17 DIAGNOSIS — M5030 Other cervical disc degeneration, unspecified cervical region: Secondary | ICD-10-CM | POA: Diagnosis not present

## 2012-12-17 DIAGNOSIS — M999 Biomechanical lesion, unspecified: Secondary | ICD-10-CM | POA: Diagnosis not present

## 2012-12-17 DIAGNOSIS — Q762 Congenital spondylolisthesis: Secondary | ICD-10-CM | POA: Diagnosis not present

## 2012-12-17 DIAGNOSIS — M9981 Other biomechanical lesions of cervical region: Secondary | ICD-10-CM | POA: Diagnosis not present

## 2012-12-17 DIAGNOSIS — IMO0002 Reserved for concepts with insufficient information to code with codable children: Secondary | ICD-10-CM | POA: Diagnosis not present

## 2012-12-20 DIAGNOSIS — L259 Unspecified contact dermatitis, unspecified cause: Secondary | ICD-10-CM | POA: Diagnosis not present

## 2013-01-01 DIAGNOSIS — M659 Synovitis and tenosynovitis, unspecified: Secondary | ICD-10-CM | POA: Diagnosis not present

## 2013-01-01 DIAGNOSIS — IMO0002 Reserved for concepts with insufficient information to code with codable children: Secondary | ICD-10-CM | POA: Diagnosis not present

## 2013-01-03 DIAGNOSIS — H01009 Unspecified blepharitis unspecified eye, unspecified eyelid: Secondary | ICD-10-CM | POA: Diagnosis not present

## 2013-01-09 DIAGNOSIS — M659 Synovitis and tenosynovitis, unspecified: Secondary | ICD-10-CM | POA: Diagnosis not present

## 2013-01-09 DIAGNOSIS — IMO0002 Reserved for concepts with insufficient information to code with codable children: Secondary | ICD-10-CM | POA: Diagnosis not present

## 2013-01-15 DIAGNOSIS — IMO0002 Reserved for concepts with insufficient information to code with codable children: Secondary | ICD-10-CM | POA: Diagnosis not present

## 2013-01-15 DIAGNOSIS — M659 Synovitis and tenosynovitis, unspecified: Secondary | ICD-10-CM | POA: Diagnosis not present

## 2013-01-23 DIAGNOSIS — M659 Synovitis and tenosynovitis, unspecified: Secondary | ICD-10-CM | POA: Diagnosis not present

## 2013-01-23 DIAGNOSIS — IMO0002 Reserved for concepts with insufficient information to code with codable children: Secondary | ICD-10-CM | POA: Diagnosis not present

## 2013-01-30 DIAGNOSIS — M659 Synovitis and tenosynovitis, unspecified: Secondary | ICD-10-CM | POA: Diagnosis not present

## 2013-01-30 DIAGNOSIS — IMO0002 Reserved for concepts with insufficient information to code with codable children: Secondary | ICD-10-CM | POA: Diagnosis not present

## 2013-02-01 DIAGNOSIS — M659 Synovitis and tenosynovitis, unspecified: Secondary | ICD-10-CM | POA: Diagnosis not present

## 2013-02-01 DIAGNOSIS — IMO0002 Reserved for concepts with insufficient information to code with codable children: Secondary | ICD-10-CM | POA: Diagnosis not present

## 2013-05-30 DIAGNOSIS — Z23 Encounter for immunization: Secondary | ICD-10-CM | POA: Diagnosis not present

## 2013-08-08 DIAGNOSIS — Z1231 Encounter for screening mammogram for malignant neoplasm of breast: Secondary | ICD-10-CM | POA: Diagnosis not present

## 2013-08-13 DIAGNOSIS — M9981 Other biomechanical lesions of cervical region: Secondary | ICD-10-CM | POA: Diagnosis not present

## 2013-08-13 DIAGNOSIS — Q762 Congenital spondylolisthesis: Secondary | ICD-10-CM | POA: Diagnosis not present

## 2013-08-13 DIAGNOSIS — IMO0002 Reserved for concepts with insufficient information to code with codable children: Secondary | ICD-10-CM | POA: Diagnosis not present

## 2013-08-13 DIAGNOSIS — M999 Biomechanical lesion, unspecified: Secondary | ICD-10-CM | POA: Diagnosis not present

## 2013-08-13 DIAGNOSIS — M5030 Other cervical disc degeneration, unspecified cervical region: Secondary | ICD-10-CM | POA: Diagnosis not present

## 2013-09-17 DIAGNOSIS — M899 Disorder of bone, unspecified: Secondary | ICD-10-CM | POA: Diagnosis not present

## 2013-09-17 DIAGNOSIS — E559 Vitamin D deficiency, unspecified: Secondary | ICD-10-CM | POA: Diagnosis not present

## 2013-09-17 DIAGNOSIS — M949 Disorder of cartilage, unspecified: Secondary | ICD-10-CM | POA: Diagnosis not present

## 2013-10-16 DIAGNOSIS — D239 Other benign neoplasm of skin, unspecified: Secondary | ICD-10-CM | POA: Diagnosis not present

## 2013-10-16 DIAGNOSIS — L259 Unspecified contact dermatitis, unspecified cause: Secondary | ICD-10-CM | POA: Diagnosis not present

## 2013-10-16 DIAGNOSIS — L821 Other seborrheic keratosis: Secondary | ICD-10-CM | POA: Diagnosis not present

## 2013-10-21 ENCOUNTER — Other Ambulatory Visit: Payer: Self-pay | Admitting: Family Medicine

## 2013-11-05 DIAGNOSIS — E039 Hypothyroidism, unspecified: Secondary | ICD-10-CM | POA: Diagnosis not present

## 2013-11-05 DIAGNOSIS — I10 Essential (primary) hypertension: Secondary | ICD-10-CM | POA: Diagnosis not present

## 2013-11-05 DIAGNOSIS — R82998 Other abnormal findings in urine: Secondary | ICD-10-CM | POA: Diagnosis not present

## 2013-11-05 DIAGNOSIS — E559 Vitamin D deficiency, unspecified: Secondary | ICD-10-CM | POA: Diagnosis not present

## 2013-11-05 DIAGNOSIS — M25579 Pain in unspecified ankle and joints of unspecified foot: Secondary | ICD-10-CM | POA: Diagnosis not present

## 2013-11-05 DIAGNOSIS — D539 Nutritional anemia, unspecified: Secondary | ICD-10-CM | POA: Diagnosis not present

## 2013-11-05 DIAGNOSIS — E785 Hyperlipidemia, unspecified: Secondary | ICD-10-CM | POA: Diagnosis not present

## 2013-11-05 DIAGNOSIS — R809 Proteinuria, unspecified: Secondary | ICD-10-CM | POA: Diagnosis not present

## 2013-11-12 DIAGNOSIS — G250 Essential tremor: Secondary | ICD-10-CM | POA: Diagnosis not present

## 2013-11-12 DIAGNOSIS — K219 Gastro-esophageal reflux disease without esophagitis: Secondary | ICD-10-CM | POA: Diagnosis not present

## 2013-11-12 DIAGNOSIS — E785 Hyperlipidemia, unspecified: Secondary | ICD-10-CM | POA: Diagnosis not present

## 2013-11-12 DIAGNOSIS — I10 Essential (primary) hypertension: Secondary | ICD-10-CM | POA: Diagnosis not present

## 2013-11-12 DIAGNOSIS — D539 Nutritional anemia, unspecified: Secondary | ICD-10-CM | POA: Diagnosis not present

## 2013-11-12 DIAGNOSIS — Z1212 Encounter for screening for malignant neoplasm of rectum: Secondary | ICD-10-CM | POA: Diagnosis not present

## 2013-11-12 DIAGNOSIS — Z0000 Encounter for general adult medical examination without abnormal findings: Secondary | ICD-10-CM | POA: Diagnosis not present

## 2013-11-12 DIAGNOSIS — Z124 Encounter for screening for malignant neoplasm of cervix: Secondary | ICD-10-CM | POA: Diagnosis not present

## 2013-11-12 DIAGNOSIS — Z79899 Other long term (current) drug therapy: Secondary | ICD-10-CM | POA: Diagnosis not present

## 2013-11-12 DIAGNOSIS — IMO0002 Reserved for concepts with insufficient information to code with codable children: Secondary | ICD-10-CM | POA: Diagnosis not present

## 2013-11-12 DIAGNOSIS — E039 Hypothyroidism, unspecified: Secondary | ICD-10-CM | POA: Diagnosis not present

## 2013-11-18 DIAGNOSIS — H52209 Unspecified astigmatism, unspecified eye: Secondary | ICD-10-CM | POA: Diagnosis not present

## 2013-11-18 DIAGNOSIS — Z961 Presence of intraocular lens: Secondary | ICD-10-CM | POA: Diagnosis not present

## 2013-11-18 DIAGNOSIS — H2640 Unspecified secondary cataract: Secondary | ICD-10-CM | POA: Diagnosis not present

## 2014-03-20 DIAGNOSIS — IMO0002 Reserved for concepts with insufficient information to code with codable children: Secondary | ICD-10-CM | POA: Diagnosis not present

## 2014-03-20 DIAGNOSIS — M999 Biomechanical lesion, unspecified: Secondary | ICD-10-CM | POA: Diagnosis not present

## 2014-03-20 DIAGNOSIS — M9981 Other biomechanical lesions of cervical region: Secondary | ICD-10-CM | POA: Diagnosis not present

## 2014-03-20 DIAGNOSIS — M5030 Other cervical disc degeneration, unspecified cervical region: Secondary | ICD-10-CM | POA: Diagnosis not present

## 2014-03-20 DIAGNOSIS — Q762 Congenital spondylolisthesis: Secondary | ICD-10-CM | POA: Diagnosis not present

## 2014-04-23 DIAGNOSIS — Z23 Encounter for immunization: Secondary | ICD-10-CM | POA: Diagnosis not present

## 2014-05-28 DIAGNOSIS — M5030 Other cervical disc degeneration, unspecified cervical region: Secondary | ICD-10-CM | POA: Diagnosis not present

## 2014-05-28 DIAGNOSIS — Q762 Congenital spondylolisthesis: Secondary | ICD-10-CM | POA: Diagnosis not present

## 2014-05-28 DIAGNOSIS — M5414 Radiculopathy, thoracic region: Secondary | ICD-10-CM | POA: Diagnosis not present

## 2014-05-28 DIAGNOSIS — M9904 Segmental and somatic dysfunction of sacral region: Secondary | ICD-10-CM | POA: Diagnosis not present

## 2014-05-28 DIAGNOSIS — M9903 Segmental and somatic dysfunction of lumbar region: Secondary | ICD-10-CM | POA: Diagnosis not present

## 2014-05-28 DIAGNOSIS — M9901 Segmental and somatic dysfunction of cervical region: Secondary | ICD-10-CM | POA: Diagnosis not present

## 2015-01-14 DIAGNOSIS — M5414 Radiculopathy, thoracic region: Secondary | ICD-10-CM | POA: Diagnosis not present

## 2015-01-14 DIAGNOSIS — M5030 Other cervical disc degeneration, unspecified cervical region: Secondary | ICD-10-CM | POA: Diagnosis not present

## 2015-01-14 DIAGNOSIS — M9904 Segmental and somatic dysfunction of sacral region: Secondary | ICD-10-CM | POA: Diagnosis not present

## 2015-01-14 DIAGNOSIS — M9901 Segmental and somatic dysfunction of cervical region: Secondary | ICD-10-CM | POA: Diagnosis not present

## 2015-01-14 DIAGNOSIS — M9903 Segmental and somatic dysfunction of lumbar region: Secondary | ICD-10-CM | POA: Diagnosis not present

## 2015-01-14 DIAGNOSIS — Q762 Congenital spondylolisthesis: Secondary | ICD-10-CM | POA: Diagnosis not present

## 2015-02-25 ENCOUNTER — Encounter: Payer: Self-pay | Admitting: Internal Medicine

## 2015-02-25 ENCOUNTER — Encounter: Payer: Self-pay | Admitting: Gastroenterology

## 2015-04-14 DIAGNOSIS — Q762 Congenital spondylolisthesis: Secondary | ICD-10-CM | POA: Diagnosis not present

## 2015-04-14 DIAGNOSIS — M9901 Segmental and somatic dysfunction of cervical region: Secondary | ICD-10-CM | POA: Diagnosis not present

## 2015-04-14 DIAGNOSIS — M5030 Other cervical disc degeneration, unspecified cervical region: Secondary | ICD-10-CM | POA: Diagnosis not present

## 2015-04-14 DIAGNOSIS — M5414 Radiculopathy, thoracic region: Secondary | ICD-10-CM | POA: Diagnosis not present

## 2015-04-14 DIAGNOSIS — M9904 Segmental and somatic dysfunction of sacral region: Secondary | ICD-10-CM | POA: Diagnosis not present

## 2015-04-14 DIAGNOSIS — M9903 Segmental and somatic dysfunction of lumbar region: Secondary | ICD-10-CM | POA: Diagnosis not present

## 2015-05-21 DIAGNOSIS — M5414 Radiculopathy, thoracic region: Secondary | ICD-10-CM | POA: Diagnosis not present

## 2015-05-21 DIAGNOSIS — Q762 Congenital spondylolisthesis: Secondary | ICD-10-CM | POA: Diagnosis not present

## 2015-05-21 DIAGNOSIS — M9903 Segmental and somatic dysfunction of lumbar region: Secondary | ICD-10-CM | POA: Diagnosis not present

## 2015-05-21 DIAGNOSIS — M5030 Other cervical disc degeneration, unspecified cervical region: Secondary | ICD-10-CM | POA: Diagnosis not present

## 2015-05-21 DIAGNOSIS — M9901 Segmental and somatic dysfunction of cervical region: Secondary | ICD-10-CM | POA: Diagnosis not present

## 2015-05-21 DIAGNOSIS — M9904 Segmental and somatic dysfunction of sacral region: Secondary | ICD-10-CM | POA: Diagnosis not present

## 2015-06-18 DIAGNOSIS — M5414 Radiculopathy, thoracic region: Secondary | ICD-10-CM | POA: Diagnosis not present

## 2015-06-18 DIAGNOSIS — M9904 Segmental and somatic dysfunction of sacral region: Secondary | ICD-10-CM | POA: Diagnosis not present

## 2015-06-18 DIAGNOSIS — M5030 Other cervical disc degeneration, unspecified cervical region: Secondary | ICD-10-CM | POA: Diagnosis not present

## 2015-06-18 DIAGNOSIS — Q762 Congenital spondylolisthesis: Secondary | ICD-10-CM | POA: Diagnosis not present

## 2015-06-18 DIAGNOSIS — M9903 Segmental and somatic dysfunction of lumbar region: Secondary | ICD-10-CM | POA: Diagnosis not present

## 2015-06-18 DIAGNOSIS — M9901 Segmental and somatic dysfunction of cervical region: Secondary | ICD-10-CM | POA: Diagnosis not present

## 2015-07-23 DIAGNOSIS — M9904 Segmental and somatic dysfunction of sacral region: Secondary | ICD-10-CM | POA: Diagnosis not present

## 2015-07-23 DIAGNOSIS — M5030 Other cervical disc degeneration, unspecified cervical region: Secondary | ICD-10-CM | POA: Diagnosis not present

## 2015-07-23 DIAGNOSIS — Q762 Congenital spondylolisthesis: Secondary | ICD-10-CM | POA: Diagnosis not present

## 2015-07-23 DIAGNOSIS — M9903 Segmental and somatic dysfunction of lumbar region: Secondary | ICD-10-CM | POA: Diagnosis not present

## 2015-07-23 DIAGNOSIS — M5414 Radiculopathy, thoracic region: Secondary | ICD-10-CM | POA: Diagnosis not present

## 2015-07-23 DIAGNOSIS — M9901 Segmental and somatic dysfunction of cervical region: Secondary | ICD-10-CM | POA: Diagnosis not present

## 2015-09-21 DIAGNOSIS — M9903 Segmental and somatic dysfunction of lumbar region: Secondary | ICD-10-CM | POA: Diagnosis not present

## 2015-09-21 DIAGNOSIS — M5030 Other cervical disc degeneration, unspecified cervical region: Secondary | ICD-10-CM | POA: Diagnosis not present

## 2015-09-21 DIAGNOSIS — M5414 Radiculopathy, thoracic region: Secondary | ICD-10-CM | POA: Diagnosis not present

## 2015-09-21 DIAGNOSIS — M9901 Segmental and somatic dysfunction of cervical region: Secondary | ICD-10-CM | POA: Diagnosis not present

## 2015-09-21 DIAGNOSIS — M9904 Segmental and somatic dysfunction of sacral region: Secondary | ICD-10-CM | POA: Diagnosis not present

## 2015-09-21 DIAGNOSIS — Q762 Congenital spondylolisthesis: Secondary | ICD-10-CM | POA: Diagnosis not present

## 2016-01-13 DIAGNOSIS — M5030 Other cervical disc degeneration, unspecified cervical region: Secondary | ICD-10-CM | POA: Diagnosis not present

## 2016-01-13 DIAGNOSIS — M9903 Segmental and somatic dysfunction of lumbar region: Secondary | ICD-10-CM | POA: Diagnosis not present

## 2016-01-13 DIAGNOSIS — M5414 Radiculopathy, thoracic region: Secondary | ICD-10-CM | POA: Diagnosis not present

## 2016-01-13 DIAGNOSIS — M9904 Segmental and somatic dysfunction of sacral region: Secondary | ICD-10-CM | POA: Diagnosis not present

## 2016-01-13 DIAGNOSIS — Q762 Congenital spondylolisthesis: Secondary | ICD-10-CM | POA: Diagnosis not present

## 2016-01-13 DIAGNOSIS — M9901 Segmental and somatic dysfunction of cervical region: Secondary | ICD-10-CM | POA: Diagnosis not present

## 2016-04-06 DIAGNOSIS — M9904 Segmental and somatic dysfunction of sacral region: Secondary | ICD-10-CM | POA: Diagnosis not present

## 2016-04-06 DIAGNOSIS — M5414 Radiculopathy, thoracic region: Secondary | ICD-10-CM | POA: Diagnosis not present

## 2016-04-06 DIAGNOSIS — M9901 Segmental and somatic dysfunction of cervical region: Secondary | ICD-10-CM | POA: Diagnosis not present

## 2016-04-06 DIAGNOSIS — Q762 Congenital spondylolisthesis: Secondary | ICD-10-CM | POA: Diagnosis not present

## 2016-04-06 DIAGNOSIS — M9903 Segmental and somatic dysfunction of lumbar region: Secondary | ICD-10-CM | POA: Diagnosis not present

## 2016-04-06 DIAGNOSIS — M5030 Other cervical disc degeneration, unspecified cervical region: Secondary | ICD-10-CM | POA: Diagnosis not present

## 2016-04-21 ENCOUNTER — Encounter: Payer: Self-pay | Admitting: Internal Medicine

## 2016-04-21 NOTE — Progress Notes (Signed)
This encounter was created in error - please disregard.

## 2016-05-24 DIAGNOSIS — M9904 Segmental and somatic dysfunction of sacral region: Secondary | ICD-10-CM | POA: Diagnosis not present

## 2016-05-24 DIAGNOSIS — Q762 Congenital spondylolisthesis: Secondary | ICD-10-CM | POA: Diagnosis not present

## 2016-05-24 DIAGNOSIS — M5414 Radiculopathy, thoracic region: Secondary | ICD-10-CM | POA: Diagnosis not present

## 2016-05-24 DIAGNOSIS — M9901 Segmental and somatic dysfunction of cervical region: Secondary | ICD-10-CM | POA: Diagnosis not present

## 2016-05-24 DIAGNOSIS — M5030 Other cervical disc degeneration, unspecified cervical region: Secondary | ICD-10-CM | POA: Diagnosis not present

## 2016-05-24 DIAGNOSIS — M9903 Segmental and somatic dysfunction of lumbar region: Secondary | ICD-10-CM | POA: Diagnosis not present

## 2016-06-21 DIAGNOSIS — M5030 Other cervical disc degeneration, unspecified cervical region: Secondary | ICD-10-CM | POA: Diagnosis not present

## 2016-06-21 DIAGNOSIS — M9901 Segmental and somatic dysfunction of cervical region: Secondary | ICD-10-CM | POA: Diagnosis not present

## 2016-06-21 DIAGNOSIS — M9903 Segmental and somatic dysfunction of lumbar region: Secondary | ICD-10-CM | POA: Diagnosis not present

## 2016-06-21 DIAGNOSIS — M5414 Radiculopathy, thoracic region: Secondary | ICD-10-CM | POA: Diagnosis not present

## 2016-06-21 DIAGNOSIS — M9904 Segmental and somatic dysfunction of sacral region: Secondary | ICD-10-CM | POA: Diagnosis not present

## 2016-06-21 DIAGNOSIS — Q762 Congenital spondylolisthesis: Secondary | ICD-10-CM | POA: Diagnosis not present

## 2016-07-26 DIAGNOSIS — M5030 Other cervical disc degeneration, unspecified cervical region: Secondary | ICD-10-CM | POA: Diagnosis not present

## 2016-07-26 DIAGNOSIS — M9903 Segmental and somatic dysfunction of lumbar region: Secondary | ICD-10-CM | POA: Diagnosis not present

## 2016-07-26 DIAGNOSIS — M9904 Segmental and somatic dysfunction of sacral region: Secondary | ICD-10-CM | POA: Diagnosis not present

## 2016-07-26 DIAGNOSIS — M5414 Radiculopathy, thoracic region: Secondary | ICD-10-CM | POA: Diagnosis not present

## 2016-07-26 DIAGNOSIS — M9901 Segmental and somatic dysfunction of cervical region: Secondary | ICD-10-CM | POA: Diagnosis not present

## 2016-07-26 DIAGNOSIS — Q762 Congenital spondylolisthesis: Secondary | ICD-10-CM | POA: Diagnosis not present

## 2016-08-01 DIAGNOSIS — Z0100 Encounter for examination of eyes and vision without abnormal findings: Secondary | ICD-10-CM | POA: Diagnosis not present

## 2016-08-01 DIAGNOSIS — H35371 Puckering of macula, right eye: Secondary | ICD-10-CM | POA: Diagnosis not present

## 2016-08-01 DIAGNOSIS — H04123 Dry eye syndrome of bilateral lacrimal glands: Secondary | ICD-10-CM | POA: Diagnosis not present

## 2016-08-01 DIAGNOSIS — H43813 Vitreous degeneration, bilateral: Secondary | ICD-10-CM | POA: Diagnosis not present

## 2016-08-02 DIAGNOSIS — Z803 Family history of malignant neoplasm of breast: Secondary | ICD-10-CM | POA: Diagnosis not present

## 2016-08-02 DIAGNOSIS — Z1231 Encounter for screening mammogram for malignant neoplasm of breast: Secondary | ICD-10-CM | POA: Diagnosis not present

## 2016-08-02 DIAGNOSIS — M859 Disorder of bone density and structure, unspecified: Secondary | ICD-10-CM | POA: Diagnosis not present

## 2016-08-18 DIAGNOSIS — H43812 Vitreous degeneration, left eye: Secondary | ICD-10-CM | POA: Diagnosis not present

## 2016-08-18 DIAGNOSIS — H35371 Puckering of macula, right eye: Secondary | ICD-10-CM | POA: Diagnosis not present

## 2016-08-18 DIAGNOSIS — Z961 Presence of intraocular lens: Secondary | ICD-10-CM | POA: Diagnosis not present

## 2016-08-23 DIAGNOSIS — M5414 Radiculopathy, thoracic region: Secondary | ICD-10-CM | POA: Diagnosis not present

## 2016-08-23 DIAGNOSIS — M9903 Segmental and somatic dysfunction of lumbar region: Secondary | ICD-10-CM | POA: Diagnosis not present

## 2016-08-23 DIAGNOSIS — M5030 Other cervical disc degeneration, unspecified cervical region: Secondary | ICD-10-CM | POA: Diagnosis not present

## 2016-08-23 DIAGNOSIS — M9904 Segmental and somatic dysfunction of sacral region: Secondary | ICD-10-CM | POA: Diagnosis not present

## 2016-08-23 DIAGNOSIS — Q762 Congenital spondylolisthesis: Secondary | ICD-10-CM | POA: Diagnosis not present

## 2016-08-23 DIAGNOSIS — M9901 Segmental and somatic dysfunction of cervical region: Secondary | ICD-10-CM | POA: Diagnosis not present

## 2016-09-12 DIAGNOSIS — H43821 Vitreomacular adhesion, right eye: Secondary | ICD-10-CM | POA: Diagnosis not present

## 2016-09-12 DIAGNOSIS — H35371 Puckering of macula, right eye: Secondary | ICD-10-CM | POA: Diagnosis not present

## 2016-09-20 DIAGNOSIS — M5414 Radiculopathy, thoracic region: Secondary | ICD-10-CM | POA: Diagnosis not present

## 2016-09-20 DIAGNOSIS — M9903 Segmental and somatic dysfunction of lumbar region: Secondary | ICD-10-CM | POA: Diagnosis not present

## 2016-09-20 DIAGNOSIS — M9904 Segmental and somatic dysfunction of sacral region: Secondary | ICD-10-CM | POA: Diagnosis not present

## 2016-09-20 DIAGNOSIS — Z09 Encounter for follow-up examination after completed treatment for conditions other than malignant neoplasm: Secondary | ICD-10-CM | POA: Diagnosis not present

## 2016-09-20 DIAGNOSIS — Q762 Congenital spondylolisthesis: Secondary | ICD-10-CM | POA: Diagnosis not present

## 2016-09-20 DIAGNOSIS — M5030 Other cervical disc degeneration, unspecified cervical region: Secondary | ICD-10-CM | POA: Diagnosis not present

## 2016-09-20 DIAGNOSIS — M9901 Segmental and somatic dysfunction of cervical region: Secondary | ICD-10-CM | POA: Diagnosis not present

## 2016-09-20 DIAGNOSIS — H35371 Puckering of macula, right eye: Secondary | ICD-10-CM | POA: Diagnosis not present

## 2016-10-18 DIAGNOSIS — M9901 Segmental and somatic dysfunction of cervical region: Secondary | ICD-10-CM | POA: Diagnosis not present

## 2016-10-18 DIAGNOSIS — M9904 Segmental and somatic dysfunction of sacral region: Secondary | ICD-10-CM | POA: Diagnosis not present

## 2016-10-18 DIAGNOSIS — Q762 Congenital spondylolisthesis: Secondary | ICD-10-CM | POA: Diagnosis not present

## 2016-10-18 DIAGNOSIS — M5030 Other cervical disc degeneration, unspecified cervical region: Secondary | ICD-10-CM | POA: Diagnosis not present

## 2016-10-18 DIAGNOSIS — M5414 Radiculopathy, thoracic region: Secondary | ICD-10-CM | POA: Diagnosis not present

## 2016-10-18 DIAGNOSIS — M9903 Segmental and somatic dysfunction of lumbar region: Secondary | ICD-10-CM | POA: Diagnosis not present

## 2016-10-25 DIAGNOSIS — D1801 Hemangioma of skin and subcutaneous tissue: Secondary | ICD-10-CM | POA: Diagnosis not present

## 2016-10-25 DIAGNOSIS — L814 Other melanin hyperpigmentation: Secondary | ICD-10-CM | POA: Diagnosis not present

## 2016-10-25 DIAGNOSIS — L720 Epidermal cyst: Secondary | ICD-10-CM | POA: Diagnosis not present

## 2016-10-25 DIAGNOSIS — L570 Actinic keratosis: Secondary | ICD-10-CM | POA: Diagnosis not present

## 2016-10-25 DIAGNOSIS — L821 Other seborrheic keratosis: Secondary | ICD-10-CM | POA: Diagnosis not present

## 2016-11-01 DIAGNOSIS — Z09 Encounter for follow-up examination after completed treatment for conditions other than malignant neoplasm: Secondary | ICD-10-CM | POA: Diagnosis not present

## 2016-11-01 DIAGNOSIS — H3561 Retinal hemorrhage, right eye: Secondary | ICD-10-CM | POA: Diagnosis not present

## 2016-12-06 DIAGNOSIS — M5030 Other cervical disc degeneration, unspecified cervical region: Secondary | ICD-10-CM | POA: Diagnosis not present

## 2016-12-06 DIAGNOSIS — H35371 Puckering of macula, right eye: Secondary | ICD-10-CM | POA: Diagnosis not present

## 2016-12-06 DIAGNOSIS — Z09 Encounter for follow-up examination after completed treatment for conditions other than malignant neoplasm: Secondary | ICD-10-CM | POA: Diagnosis not present

## 2016-12-06 DIAGNOSIS — M9904 Segmental and somatic dysfunction of sacral region: Secondary | ICD-10-CM | POA: Diagnosis not present

## 2016-12-06 DIAGNOSIS — Q762 Congenital spondylolisthesis: Secondary | ICD-10-CM | POA: Diagnosis not present

## 2016-12-06 DIAGNOSIS — M9901 Segmental and somatic dysfunction of cervical region: Secondary | ICD-10-CM | POA: Diagnosis not present

## 2016-12-06 DIAGNOSIS — M5414 Radiculopathy, thoracic region: Secondary | ICD-10-CM | POA: Diagnosis not present

## 2016-12-06 DIAGNOSIS — M9903 Segmental and somatic dysfunction of lumbar region: Secondary | ICD-10-CM | POA: Diagnosis not present

## 2016-12-28 DIAGNOSIS — E784 Other hyperlipidemia: Secondary | ICD-10-CM | POA: Diagnosis not present

## 2016-12-28 DIAGNOSIS — I10 Essential (primary) hypertension: Secondary | ICD-10-CM | POA: Diagnosis not present

## 2016-12-28 DIAGNOSIS — E559 Vitamin D deficiency, unspecified: Secondary | ICD-10-CM | POA: Diagnosis not present

## 2016-12-28 DIAGNOSIS — E038 Other specified hypothyroidism: Secondary | ICD-10-CM | POA: Diagnosis not present

## 2017-01-05 DIAGNOSIS — E038 Other specified hypothyroidism: Secondary | ICD-10-CM | POA: Diagnosis not present

## 2017-01-05 DIAGNOSIS — E784 Other hyperlipidemia: Secondary | ICD-10-CM | POA: Diagnosis not present

## 2017-01-05 DIAGNOSIS — M810 Age-related osteoporosis without current pathological fracture: Secondary | ICD-10-CM | POA: Diagnosis not present

## 2017-01-05 DIAGNOSIS — R300 Dysuria: Secondary | ICD-10-CM | POA: Diagnosis not present

## 2017-01-05 DIAGNOSIS — E871 Hypo-osmolality and hyponatremia: Secondary | ICD-10-CM | POA: Diagnosis not present

## 2017-01-05 DIAGNOSIS — I10 Essential (primary) hypertension: Secondary | ICD-10-CM | POA: Diagnosis not present

## 2017-01-05 DIAGNOSIS — Z1389 Encounter for screening for other disorder: Secondary | ICD-10-CM | POA: Diagnosis not present

## 2017-01-05 DIAGNOSIS — Z6826 Body mass index (BMI) 26.0-26.9, adult: Secondary | ICD-10-CM | POA: Diagnosis not present

## 2017-01-05 DIAGNOSIS — M1990 Unspecified osteoarthritis, unspecified site: Secondary | ICD-10-CM | POA: Diagnosis not present

## 2017-01-05 DIAGNOSIS — Z1231 Encounter for screening mammogram for malignant neoplasm of breast: Secondary | ICD-10-CM | POA: Diagnosis not present

## 2017-01-05 DIAGNOSIS — G252 Other specified forms of tremor: Secondary | ICD-10-CM | POA: Diagnosis not present

## 2017-01-05 DIAGNOSIS — Z0000 Encounter for general adult medical examination without abnormal findings: Secondary | ICD-10-CM | POA: Diagnosis not present

## 2017-03-01 DIAGNOSIS — R21 Rash and other nonspecific skin eruption: Secondary | ICD-10-CM | POA: Diagnosis not present

## 2017-03-08 DIAGNOSIS — M5030 Other cervical disc degeneration, unspecified cervical region: Secondary | ICD-10-CM | POA: Diagnosis not present

## 2017-03-08 DIAGNOSIS — M9901 Segmental and somatic dysfunction of cervical region: Secondary | ICD-10-CM | POA: Diagnosis not present

## 2017-03-08 DIAGNOSIS — M9903 Segmental and somatic dysfunction of lumbar region: Secondary | ICD-10-CM | POA: Diagnosis not present

## 2017-03-08 DIAGNOSIS — M9904 Segmental and somatic dysfunction of sacral region: Secondary | ICD-10-CM | POA: Diagnosis not present

## 2017-03-08 DIAGNOSIS — Q762 Congenital spondylolisthesis: Secondary | ICD-10-CM | POA: Diagnosis not present

## 2017-03-08 DIAGNOSIS — M5414 Radiculopathy, thoracic region: Secondary | ICD-10-CM | POA: Diagnosis not present

## 2017-05-01 DIAGNOSIS — Z23 Encounter for immunization: Secondary | ICD-10-CM | POA: Diagnosis not present

## 2017-05-03 DIAGNOSIS — M5414 Radiculopathy, thoracic region: Secondary | ICD-10-CM | POA: Diagnosis not present

## 2017-05-03 DIAGNOSIS — M9903 Segmental and somatic dysfunction of lumbar region: Secondary | ICD-10-CM | POA: Diagnosis not present

## 2017-05-03 DIAGNOSIS — M5030 Other cervical disc degeneration, unspecified cervical region: Secondary | ICD-10-CM | POA: Diagnosis not present

## 2017-05-03 DIAGNOSIS — M9901 Segmental and somatic dysfunction of cervical region: Secondary | ICD-10-CM | POA: Diagnosis not present

## 2017-05-03 DIAGNOSIS — M9904 Segmental and somatic dysfunction of sacral region: Secondary | ICD-10-CM | POA: Diagnosis not present

## 2017-05-03 DIAGNOSIS — Q762 Congenital spondylolisthesis: Secondary | ICD-10-CM | POA: Diagnosis not present

## 2017-05-30 DIAGNOSIS — H35371 Puckering of macula, right eye: Secondary | ICD-10-CM | POA: Diagnosis not present

## 2017-05-30 DIAGNOSIS — Z961 Presence of intraocular lens: Secondary | ICD-10-CM | POA: Diagnosis not present

## 2017-05-30 DIAGNOSIS — H43812 Vitreous degeneration, left eye: Secondary | ICD-10-CM | POA: Diagnosis not present

## 2017-05-30 DIAGNOSIS — M9901 Segmental and somatic dysfunction of cervical region: Secondary | ICD-10-CM | POA: Diagnosis not present

## 2017-05-30 DIAGNOSIS — M5414 Radiculopathy, thoracic region: Secondary | ICD-10-CM | POA: Diagnosis not present

## 2017-05-30 DIAGNOSIS — Q762 Congenital spondylolisthesis: Secondary | ICD-10-CM | POA: Diagnosis not present

## 2017-05-30 DIAGNOSIS — M5030 Other cervical disc degeneration, unspecified cervical region: Secondary | ICD-10-CM | POA: Diagnosis not present

## 2017-05-30 DIAGNOSIS — M9903 Segmental and somatic dysfunction of lumbar region: Secondary | ICD-10-CM | POA: Diagnosis not present

## 2017-05-30 DIAGNOSIS — M9904 Segmental and somatic dysfunction of sacral region: Secondary | ICD-10-CM | POA: Diagnosis not present

## 2017-07-27 DIAGNOSIS — J40 Bronchitis, not specified as acute or chronic: Secondary | ICD-10-CM | POA: Diagnosis not present

## 2017-07-27 DIAGNOSIS — R197 Diarrhea, unspecified: Secondary | ICD-10-CM | POA: Diagnosis not present

## 2017-07-27 DIAGNOSIS — Z6826 Body mass index (BMI) 26.0-26.9, adult: Secondary | ICD-10-CM | POA: Diagnosis not present

## 2017-07-28 DIAGNOSIS — R197 Diarrhea, unspecified: Secondary | ICD-10-CM | POA: Diagnosis not present

## 2017-08-02 DIAGNOSIS — R197 Diarrhea, unspecified: Secondary | ICD-10-CM | POA: Diagnosis not present

## 2017-08-03 DIAGNOSIS — Z1231 Encounter for screening mammogram for malignant neoplasm of breast: Secondary | ICD-10-CM | POA: Diagnosis not present

## 2017-08-04 DIAGNOSIS — H52203 Unspecified astigmatism, bilateral: Secondary | ICD-10-CM | POA: Diagnosis not present

## 2017-08-04 DIAGNOSIS — H04123 Dry eye syndrome of bilateral lacrimal glands: Secondary | ICD-10-CM | POA: Diagnosis not present

## 2017-08-04 DIAGNOSIS — H02831 Dermatochalasis of right upper eyelid: Secondary | ICD-10-CM | POA: Diagnosis not present

## 2017-08-04 DIAGNOSIS — H02834 Dermatochalasis of left upper eyelid: Secondary | ICD-10-CM | POA: Diagnosis not present

## 2017-08-16 DIAGNOSIS — Z6826 Body mass index (BMI) 26.0-26.9, adult: Secondary | ICD-10-CM | POA: Diagnosis not present

## 2017-08-16 DIAGNOSIS — R197 Diarrhea, unspecified: Secondary | ICD-10-CM | POA: Diagnosis not present

## 2017-08-16 DIAGNOSIS — A0472 Enterocolitis due to Clostridium difficile, not specified as recurrent: Secondary | ICD-10-CM | POA: Diagnosis not present

## 2017-08-17 DIAGNOSIS — R197 Diarrhea, unspecified: Secondary | ICD-10-CM | POA: Diagnosis not present

## 2017-09-06 DIAGNOSIS — R197 Diarrhea, unspecified: Secondary | ICD-10-CM | POA: Diagnosis not present

## 2017-09-23 ENCOUNTER — Encounter: Payer: Self-pay | Admitting: Internal Medicine

## 2017-10-26 DIAGNOSIS — L821 Other seborrheic keratosis: Secondary | ICD-10-CM | POA: Diagnosis not present

## 2017-10-26 DIAGNOSIS — D225 Melanocytic nevi of trunk: Secondary | ICD-10-CM | POA: Diagnosis not present

## 2017-10-31 DIAGNOSIS — H35371 Puckering of macula, right eye: Secondary | ICD-10-CM | POA: Diagnosis not present

## 2017-10-31 DIAGNOSIS — H43812 Vitreous degeneration, left eye: Secondary | ICD-10-CM | POA: Diagnosis not present

## 2017-10-31 DIAGNOSIS — Z961 Presence of intraocular lens: Secondary | ICD-10-CM | POA: Diagnosis not present

## 2018-02-02 DIAGNOSIS — S82832A Other fracture of upper and lower end of left fibula, initial encounter for closed fracture: Secondary | ICD-10-CM | POA: Diagnosis not present

## 2018-02-02 DIAGNOSIS — M25572 Pain in left ankle and joints of left foot: Secondary | ICD-10-CM | POA: Diagnosis not present

## 2018-02-09 DIAGNOSIS — M25572 Pain in left ankle and joints of left foot: Secondary | ICD-10-CM | POA: Diagnosis not present

## 2018-02-21 DIAGNOSIS — E7849 Other hyperlipidemia: Secondary | ICD-10-CM | POA: Diagnosis not present

## 2018-02-21 DIAGNOSIS — E559 Vitamin D deficiency, unspecified: Secondary | ICD-10-CM | POA: Diagnosis not present

## 2018-02-21 DIAGNOSIS — R82998 Other abnormal findings in urine: Secondary | ICD-10-CM | POA: Diagnosis not present

## 2018-02-21 DIAGNOSIS — E038 Other specified hypothyroidism: Secondary | ICD-10-CM | POA: Diagnosis not present

## 2018-02-21 DIAGNOSIS — I10 Essential (primary) hypertension: Secondary | ICD-10-CM | POA: Diagnosis not present

## 2018-02-22 DIAGNOSIS — E7849 Other hyperlipidemia: Secondary | ICD-10-CM | POA: Diagnosis not present

## 2018-02-22 DIAGNOSIS — M5380 Other specified dorsopathies, site unspecified: Secondary | ICD-10-CM | POA: Diagnosis not present

## 2018-02-22 DIAGNOSIS — M810 Age-related osteoporosis without current pathological fracture: Secondary | ICD-10-CM | POA: Diagnosis not present

## 2018-02-22 DIAGNOSIS — Z0000 Encounter for general adult medical examination without abnormal findings: Secondary | ICD-10-CM | POA: Diagnosis not present

## 2018-02-22 DIAGNOSIS — A0472 Enterocolitis due to Clostridium difficile, not specified as recurrent: Secondary | ICD-10-CM | POA: Diagnosis not present

## 2018-02-22 DIAGNOSIS — G252 Other specified forms of tremor: Secondary | ICD-10-CM | POA: Diagnosis not present

## 2018-02-22 DIAGNOSIS — Z6824 Body mass index (BMI) 24.0-24.9, adult: Secondary | ICD-10-CM | POA: Diagnosis not present

## 2018-02-22 DIAGNOSIS — I10 Essential (primary) hypertension: Secondary | ICD-10-CM | POA: Diagnosis not present

## 2018-02-22 DIAGNOSIS — E038 Other specified hypothyroidism: Secondary | ICD-10-CM | POA: Diagnosis not present

## 2018-02-22 DIAGNOSIS — R3129 Other microscopic hematuria: Secondary | ICD-10-CM | POA: Diagnosis not present

## 2018-02-22 DIAGNOSIS — H6122 Impacted cerumen, left ear: Secondary | ICD-10-CM | POA: Diagnosis not present

## 2018-02-22 DIAGNOSIS — Z1389 Encounter for screening for other disorder: Secondary | ICD-10-CM | POA: Diagnosis not present

## 2018-03-02 DIAGNOSIS — M25572 Pain in left ankle and joints of left foot: Secondary | ICD-10-CM | POA: Diagnosis not present

## 2018-03-08 DIAGNOSIS — S82832A Other fracture of upper and lower end of left fibula, initial encounter for closed fracture: Secondary | ICD-10-CM | POA: Diagnosis not present

## 2018-03-08 DIAGNOSIS — M6281 Muscle weakness (generalized): Secondary | ICD-10-CM | POA: Diagnosis not present

## 2018-03-08 DIAGNOSIS — R262 Difficulty in walking, not elsewhere classified: Secondary | ICD-10-CM | POA: Diagnosis not present

## 2018-03-08 DIAGNOSIS — M25572 Pain in left ankle and joints of left foot: Secondary | ICD-10-CM | POA: Diagnosis not present

## 2018-03-14 DIAGNOSIS — S82832A Other fracture of upper and lower end of left fibula, initial encounter for closed fracture: Secondary | ICD-10-CM | POA: Diagnosis not present

## 2018-03-14 DIAGNOSIS — R262 Difficulty in walking, not elsewhere classified: Secondary | ICD-10-CM | POA: Diagnosis not present

## 2018-03-14 DIAGNOSIS — M6281 Muscle weakness (generalized): Secondary | ICD-10-CM | POA: Diagnosis not present

## 2018-03-14 DIAGNOSIS — M25572 Pain in left ankle and joints of left foot: Secondary | ICD-10-CM | POA: Diagnosis not present

## 2018-03-16 DIAGNOSIS — M25572 Pain in left ankle and joints of left foot: Secondary | ICD-10-CM | POA: Diagnosis not present

## 2018-03-16 DIAGNOSIS — M6281 Muscle weakness (generalized): Secondary | ICD-10-CM | POA: Diagnosis not present

## 2018-03-16 DIAGNOSIS — R262 Difficulty in walking, not elsewhere classified: Secondary | ICD-10-CM | POA: Diagnosis not present

## 2018-03-16 DIAGNOSIS — S82832A Other fracture of upper and lower end of left fibula, initial encounter for closed fracture: Secondary | ICD-10-CM | POA: Diagnosis not present

## 2018-03-20 DIAGNOSIS — Z1211 Encounter for screening for malignant neoplasm of colon: Secondary | ICD-10-CM | POA: Diagnosis not present

## 2018-03-20 DIAGNOSIS — Z1212 Encounter for screening for malignant neoplasm of rectum: Secondary | ICD-10-CM | POA: Diagnosis not present

## 2018-03-21 DIAGNOSIS — M25572 Pain in left ankle and joints of left foot: Secondary | ICD-10-CM | POA: Diagnosis not present

## 2018-03-21 DIAGNOSIS — R262 Difficulty in walking, not elsewhere classified: Secondary | ICD-10-CM | POA: Diagnosis not present

## 2018-03-21 DIAGNOSIS — S82832A Other fracture of upper and lower end of left fibula, initial encounter for closed fracture: Secondary | ICD-10-CM | POA: Diagnosis not present

## 2018-03-21 DIAGNOSIS — M6281 Muscle weakness (generalized): Secondary | ICD-10-CM | POA: Diagnosis not present

## 2018-03-23 DIAGNOSIS — M25572 Pain in left ankle and joints of left foot: Secondary | ICD-10-CM | POA: Diagnosis not present

## 2018-03-23 DIAGNOSIS — R262 Difficulty in walking, not elsewhere classified: Secondary | ICD-10-CM | POA: Diagnosis not present

## 2018-03-23 DIAGNOSIS — M6281 Muscle weakness (generalized): Secondary | ICD-10-CM | POA: Diagnosis not present

## 2018-03-28 DIAGNOSIS — R262 Difficulty in walking, not elsewhere classified: Secondary | ICD-10-CM | POA: Diagnosis not present

## 2018-03-28 DIAGNOSIS — M25572 Pain in left ankle and joints of left foot: Secondary | ICD-10-CM | POA: Diagnosis not present

## 2018-03-28 DIAGNOSIS — M6281 Muscle weakness (generalized): Secondary | ICD-10-CM | POA: Diagnosis not present

## 2018-03-28 DIAGNOSIS — S82832A Other fracture of upper and lower end of left fibula, initial encounter for closed fracture: Secondary | ICD-10-CM | POA: Diagnosis not present

## 2018-03-30 DIAGNOSIS — M6281 Muscle weakness (generalized): Secondary | ICD-10-CM | POA: Diagnosis not present

## 2018-03-30 DIAGNOSIS — M25572 Pain in left ankle and joints of left foot: Secondary | ICD-10-CM | POA: Diagnosis not present

## 2018-03-30 DIAGNOSIS — S82832A Other fracture of upper and lower end of left fibula, initial encounter for closed fracture: Secondary | ICD-10-CM | POA: Diagnosis not present

## 2018-04-11 DIAGNOSIS — Z23 Encounter for immunization: Secondary | ICD-10-CM | POA: Diagnosis not present

## 2018-08-09 DIAGNOSIS — Z1231 Encounter for screening mammogram for malignant neoplasm of breast: Secondary | ICD-10-CM | POA: Diagnosis not present

## 2018-08-09 DIAGNOSIS — Z803 Family history of malignant neoplasm of breast: Secondary | ICD-10-CM | POA: Diagnosis not present

## 2018-08-16 DIAGNOSIS — H53411 Scotoma involving central area, right eye: Secondary | ICD-10-CM | POA: Diagnosis not present

## 2018-08-16 DIAGNOSIS — H43813 Vitreous degeneration, bilateral: Secondary | ICD-10-CM | POA: Diagnosis not present

## 2018-08-16 DIAGNOSIS — Z961 Presence of intraocular lens: Secondary | ICD-10-CM | POA: Diagnosis not present

## 2018-08-16 DIAGNOSIS — H52203 Unspecified astigmatism, bilateral: Secondary | ICD-10-CM | POA: Diagnosis not present

## 2018-08-23 DIAGNOSIS — N6489 Other specified disorders of breast: Secondary | ICD-10-CM | POA: Diagnosis not present

## 2018-08-23 DIAGNOSIS — R922 Inconclusive mammogram: Secondary | ICD-10-CM | POA: Diagnosis not present

## 2018-09-06 DIAGNOSIS — M810 Age-related osteoporosis without current pathological fracture: Secondary | ICD-10-CM | POA: Diagnosis not present

## 2019-02-21 DIAGNOSIS — E038 Other specified hypothyroidism: Secondary | ICD-10-CM | POA: Diagnosis not present

## 2019-02-21 DIAGNOSIS — E7849 Other hyperlipidemia: Secondary | ICD-10-CM | POA: Diagnosis not present

## 2019-02-21 DIAGNOSIS — M810 Age-related osteoporosis without current pathological fracture: Secondary | ICD-10-CM | POA: Diagnosis not present

## 2019-02-25 DIAGNOSIS — I10 Essential (primary) hypertension: Secondary | ICD-10-CM | POA: Diagnosis not present

## 2019-02-25 DIAGNOSIS — R82998 Other abnormal findings in urine: Secondary | ICD-10-CM | POA: Diagnosis not present

## 2019-02-25 DIAGNOSIS — Z1331 Encounter for screening for depression: Secondary | ICD-10-CM | POA: Diagnosis not present

## 2019-02-25 DIAGNOSIS — G252 Other specified forms of tremor: Secondary | ICD-10-CM | POA: Diagnosis not present

## 2019-02-25 DIAGNOSIS — M1990 Unspecified osteoarthritis, unspecified site: Secondary | ICD-10-CM | POA: Diagnosis not present

## 2019-02-25 DIAGNOSIS — E785 Hyperlipidemia, unspecified: Secondary | ICD-10-CM | POA: Diagnosis not present

## 2019-02-25 DIAGNOSIS — R011 Cardiac murmur, unspecified: Secondary | ICD-10-CM | POA: Diagnosis not present

## 2019-02-25 DIAGNOSIS — K219 Gastro-esophageal reflux disease without esophagitis: Secondary | ICD-10-CM | POA: Diagnosis not present

## 2019-02-25 DIAGNOSIS — M5380 Other specified dorsopathies, site unspecified: Secondary | ICD-10-CM | POA: Diagnosis not present

## 2019-02-25 DIAGNOSIS — E559 Vitamin D deficiency, unspecified: Secondary | ICD-10-CM | POA: Diagnosis not present

## 2019-02-25 DIAGNOSIS — E039 Hypothyroidism, unspecified: Secondary | ICD-10-CM | POA: Diagnosis not present

## 2019-02-25 DIAGNOSIS — M810 Age-related osteoporosis without current pathological fracture: Secondary | ICD-10-CM | POA: Diagnosis not present

## 2019-02-25 DIAGNOSIS — Z0000 Encounter for general adult medical examination without abnormal findings: Secondary | ICD-10-CM | POA: Diagnosis not present

## 2019-03-29 DIAGNOSIS — Z23 Encounter for immunization: Secondary | ICD-10-CM | POA: Diagnosis not present

## 2019-06-10 DIAGNOSIS — L448 Other specified papulosquamous disorders: Secondary | ICD-10-CM | POA: Diagnosis not present

## 2019-06-10 DIAGNOSIS — L821 Other seborrheic keratosis: Secondary | ICD-10-CM | POA: Diagnosis not present

## 2019-06-10 DIAGNOSIS — L538 Other specified erythematous conditions: Secondary | ICD-10-CM | POA: Diagnosis not present

## 2019-06-10 DIAGNOSIS — L820 Inflamed seborrheic keratosis: Secondary | ICD-10-CM | POA: Diagnosis not present

## 2019-06-10 DIAGNOSIS — L570 Actinic keratosis: Secondary | ICD-10-CM | POA: Diagnosis not present

## 2019-06-10 DIAGNOSIS — D2271 Melanocytic nevi of right lower limb, including hip: Secondary | ICD-10-CM | POA: Diagnosis not present

## 2019-06-10 DIAGNOSIS — R208 Other disturbances of skin sensation: Secondary | ICD-10-CM | POA: Diagnosis not present

## 2019-09-23 DIAGNOSIS — Z1231 Encounter for screening mammogram for malignant neoplasm of breast: Secondary | ICD-10-CM | POA: Diagnosis not present

## 2019-09-23 DIAGNOSIS — H53411 Scotoma involving central area, right eye: Secondary | ICD-10-CM | POA: Diagnosis not present

## 2019-09-23 DIAGNOSIS — H52203 Unspecified astigmatism, bilateral: Secondary | ICD-10-CM | POA: Diagnosis not present

## 2019-09-23 DIAGNOSIS — Z961 Presence of intraocular lens: Secondary | ICD-10-CM | POA: Diagnosis not present

## 2019-09-23 DIAGNOSIS — H43812 Vitreous degeneration, left eye: Secondary | ICD-10-CM | POA: Diagnosis not present

## 2020-02-08 DIAGNOSIS — I10 Essential (primary) hypertension: Secondary | ICD-10-CM | POA: Diagnosis not present

## 2020-02-08 DIAGNOSIS — S52572A Other intraarticular fracture of lower end of left radius, initial encounter for closed fracture: Secondary | ICD-10-CM | POA: Diagnosis not present

## 2020-02-08 DIAGNOSIS — S52502A Unspecified fracture of the lower end of left radius, initial encounter for closed fracture: Secondary | ICD-10-CM | POA: Diagnosis not present

## 2020-02-13 DIAGNOSIS — S52572A Other intraarticular fracture of lower end of left radius, initial encounter for closed fracture: Secondary | ICD-10-CM | POA: Diagnosis not present

## 2020-02-18 DIAGNOSIS — S52572A Other intraarticular fracture of lower end of left radius, initial encounter for closed fracture: Secondary | ICD-10-CM | POA: Diagnosis not present

## 2020-02-25 DIAGNOSIS — S52572D Other intraarticular fracture of lower end of left radius, subsequent encounter for closed fracture with routine healing: Secondary | ICD-10-CM | POA: Diagnosis not present

## 2020-03-05 DIAGNOSIS — S52572D Other intraarticular fracture of lower end of left radius, subsequent encounter for closed fracture with routine healing: Secondary | ICD-10-CM | POA: Diagnosis not present

## 2020-03-19 DIAGNOSIS — E785 Hyperlipidemia, unspecified: Secondary | ICD-10-CM | POA: Diagnosis not present

## 2020-03-19 DIAGNOSIS — E039 Hypothyroidism, unspecified: Secondary | ICD-10-CM | POA: Diagnosis not present

## 2020-03-19 DIAGNOSIS — E559 Vitamin D deficiency, unspecified: Secondary | ICD-10-CM | POA: Diagnosis not present

## 2020-03-25 DIAGNOSIS — S52572D Other intraarticular fracture of lower end of left radius, subsequent encounter for closed fracture with routine healing: Secondary | ICD-10-CM | POA: Diagnosis not present

## 2020-03-26 DIAGNOSIS — H3530 Unspecified macular degeneration: Secondary | ICD-10-CM | POA: Diagnosis not present

## 2020-03-26 DIAGNOSIS — E785 Hyperlipidemia, unspecified: Secondary | ICD-10-CM | POA: Diagnosis not present

## 2020-03-26 DIAGNOSIS — M810 Age-related osteoporosis without current pathological fracture: Secondary | ICD-10-CM | POA: Diagnosis not present

## 2020-03-26 DIAGNOSIS — E871 Hypo-osmolality and hyponatremia: Secondary | ICD-10-CM | POA: Diagnosis not present

## 2020-03-26 DIAGNOSIS — G252 Other specified forms of tremor: Secondary | ICD-10-CM | POA: Diagnosis not present

## 2020-03-26 DIAGNOSIS — R82998 Other abnormal findings in urine: Secondary | ICD-10-CM | POA: Diagnosis not present

## 2020-03-26 DIAGNOSIS — E559 Vitamin D deficiency, unspecified: Secondary | ICD-10-CM | POA: Diagnosis not present

## 2020-03-26 DIAGNOSIS — Z1212 Encounter for screening for malignant neoplasm of rectum: Secondary | ICD-10-CM | POA: Diagnosis not present

## 2020-03-26 DIAGNOSIS — R011 Cardiac murmur, unspecified: Secondary | ICD-10-CM | POA: Diagnosis not present

## 2020-03-26 DIAGNOSIS — R319 Hematuria, unspecified: Secondary | ICD-10-CM | POA: Diagnosis not present

## 2020-03-26 DIAGNOSIS — Z0000 Encounter for general adult medical examination without abnormal findings: Secondary | ICD-10-CM | POA: Diagnosis not present

## 2020-03-26 DIAGNOSIS — E039 Hypothyroidism, unspecified: Secondary | ICD-10-CM | POA: Diagnosis not present

## 2020-03-26 DIAGNOSIS — Z1331 Encounter for screening for depression: Secondary | ICD-10-CM | POA: Diagnosis not present

## 2020-03-26 DIAGNOSIS — I10 Essential (primary) hypertension: Secondary | ICD-10-CM | POA: Diagnosis not present

## 2020-03-30 DIAGNOSIS — M79645 Pain in left finger(s): Secondary | ICD-10-CM | POA: Diagnosis not present

## 2020-03-30 DIAGNOSIS — S52592A Other fractures of lower end of left radius, initial encounter for closed fracture: Secondary | ICD-10-CM | POA: Diagnosis not present

## 2020-03-30 DIAGNOSIS — M25532 Pain in left wrist: Secondary | ICD-10-CM | POA: Diagnosis not present

## 2020-04-03 DIAGNOSIS — M25532 Pain in left wrist: Secondary | ICD-10-CM | POA: Diagnosis not present

## 2020-04-03 DIAGNOSIS — M79645 Pain in left finger(s): Secondary | ICD-10-CM | POA: Diagnosis not present

## 2020-04-03 DIAGNOSIS — S52592A Other fractures of lower end of left radius, initial encounter for closed fracture: Secondary | ICD-10-CM | POA: Diagnosis not present

## 2020-04-08 DIAGNOSIS — M25532 Pain in left wrist: Secondary | ICD-10-CM | POA: Diagnosis not present

## 2020-04-08 DIAGNOSIS — S52592A Other fractures of lower end of left radius, initial encounter for closed fracture: Secondary | ICD-10-CM | POA: Diagnosis not present

## 2020-04-08 DIAGNOSIS — Z23 Encounter for immunization: Secondary | ICD-10-CM | POA: Diagnosis not present

## 2020-04-08 DIAGNOSIS — M79645 Pain in left finger(s): Secondary | ICD-10-CM | POA: Diagnosis not present

## 2020-04-10 DIAGNOSIS — M25532 Pain in left wrist: Secondary | ICD-10-CM | POA: Diagnosis not present

## 2020-04-10 DIAGNOSIS — M79645 Pain in left finger(s): Secondary | ICD-10-CM | POA: Diagnosis not present

## 2020-04-10 DIAGNOSIS — S52592A Other fractures of lower end of left radius, initial encounter for closed fracture: Secondary | ICD-10-CM | POA: Diagnosis not present

## 2020-04-15 DIAGNOSIS — S52572D Other intraarticular fracture of lower end of left radius, subsequent encounter for closed fracture with routine healing: Secondary | ICD-10-CM | POA: Diagnosis not present

## 2020-04-15 DIAGNOSIS — M19042 Primary osteoarthritis, left hand: Secondary | ICD-10-CM | POA: Diagnosis not present

## 2020-04-15 DIAGNOSIS — S52592A Other fractures of lower end of left radius, initial encounter for closed fracture: Secondary | ICD-10-CM | POA: Diagnosis not present

## 2020-04-15 DIAGNOSIS — M79645 Pain in left finger(s): Secondary | ICD-10-CM | POA: Diagnosis not present

## 2020-04-15 DIAGNOSIS — M25532 Pain in left wrist: Secondary | ICD-10-CM | POA: Diagnosis not present

## 2020-04-17 DIAGNOSIS — S52592A Other fractures of lower end of left radius, initial encounter for closed fracture: Secondary | ICD-10-CM | POA: Diagnosis not present

## 2020-04-17 DIAGNOSIS — M25532 Pain in left wrist: Secondary | ICD-10-CM | POA: Diagnosis not present

## 2020-04-17 DIAGNOSIS — M79645 Pain in left finger(s): Secondary | ICD-10-CM | POA: Diagnosis not present

## 2020-04-20 DIAGNOSIS — M79645 Pain in left finger(s): Secondary | ICD-10-CM | POA: Diagnosis not present

## 2020-04-20 DIAGNOSIS — S52592A Other fractures of lower end of left radius, initial encounter for closed fracture: Secondary | ICD-10-CM | POA: Diagnosis not present

## 2020-04-20 DIAGNOSIS — M25532 Pain in left wrist: Secondary | ICD-10-CM | POA: Diagnosis not present

## 2020-04-27 DIAGNOSIS — M25532 Pain in left wrist: Secondary | ICD-10-CM | POA: Diagnosis not present

## 2020-04-27 DIAGNOSIS — S52592A Other fractures of lower end of left radius, initial encounter for closed fracture: Secondary | ICD-10-CM | POA: Diagnosis not present

## 2020-04-27 DIAGNOSIS — M79645 Pain in left finger(s): Secondary | ICD-10-CM | POA: Diagnosis not present

## 2020-05-01 DIAGNOSIS — M25532 Pain in left wrist: Secondary | ICD-10-CM | POA: Diagnosis not present

## 2020-05-01 DIAGNOSIS — M79645 Pain in left finger(s): Secondary | ICD-10-CM | POA: Diagnosis not present

## 2020-05-01 DIAGNOSIS — S52592A Other fractures of lower end of left radius, initial encounter for closed fracture: Secondary | ICD-10-CM | POA: Diagnosis not present

## 2020-05-06 DIAGNOSIS — M25532 Pain in left wrist: Secondary | ICD-10-CM | POA: Diagnosis not present

## 2020-05-06 DIAGNOSIS — S52592A Other fractures of lower end of left radius, initial encounter for closed fracture: Secondary | ICD-10-CM | POA: Diagnosis not present

## 2020-05-06 DIAGNOSIS — M79645 Pain in left finger(s): Secondary | ICD-10-CM | POA: Diagnosis not present

## 2020-05-19 DIAGNOSIS — R059 Cough, unspecified: Secondary | ICD-10-CM | POA: Diagnosis not present

## 2020-05-19 DIAGNOSIS — Z1152 Encounter for screening for COVID-19: Secondary | ICD-10-CM | POA: Diagnosis not present

## 2020-05-21 DIAGNOSIS — E039 Hypothyroidism, unspecified: Secondary | ICD-10-CM | POA: Diagnosis not present

## 2020-06-09 DIAGNOSIS — Z96642 Presence of left artificial hip joint: Secondary | ICD-10-CM | POA: Diagnosis not present

## 2020-06-09 DIAGNOSIS — M25552 Pain in left hip: Secondary | ICD-10-CM | POA: Diagnosis not present

## 2020-10-01 DIAGNOSIS — H43812 Vitreous degeneration, left eye: Secondary | ICD-10-CM | POA: Diagnosis not present

## 2020-10-01 DIAGNOSIS — Z961 Presence of intraocular lens: Secondary | ICD-10-CM | POA: Diagnosis not present

## 2020-10-01 DIAGNOSIS — H31001 Unspecified chorioretinal scars, right eye: Secondary | ICD-10-CM | POA: Diagnosis not present

## 2020-10-01 DIAGNOSIS — D23122 Other benign neoplasm of skin of left lower eyelid, including canthus: Secondary | ICD-10-CM | POA: Diagnosis not present

## 2020-10-09 ENCOUNTER — Other Ambulatory Visit (HOSPITAL_COMMUNITY): Payer: Self-pay | Admitting: Internal Medicine

## 2020-10-09 DIAGNOSIS — R011 Cardiac murmur, unspecified: Secondary | ICD-10-CM

## 2020-10-15 ENCOUNTER — Telehealth (HOSPITAL_COMMUNITY): Payer: Self-pay | Admitting: Internal Medicine

## 2020-10-15 NOTE — Telephone Encounter (Signed)
Just an FYI. 10/13/20 patient declined to schedule echcoardiogram and states she knew nothing about this! @ 9:57/LBW  patient will call me back is she needs she states/LBW Order will be removed from the [facility] and if patient calls back we will reinstate the order. Thank you    Thank you

## 2020-11-17 ENCOUNTER — Ambulatory Visit (HOSPITAL_COMMUNITY): Payer: Medicare Other | Attending: Cardiovascular Disease

## 2020-11-17 ENCOUNTER — Other Ambulatory Visit: Payer: Self-pay

## 2020-11-17 DIAGNOSIS — R011 Cardiac murmur, unspecified: Secondary | ICD-10-CM

## 2020-11-17 LAB — ECHOCARDIOGRAM COMPLETE
Area-P 1/2: 3.54 cm2
S' Lateral: 2.1 cm

## 2020-11-17 MED ORDER — PERFLUTREN LIPID MICROSPHERE
1.0000 mL | INTRAVENOUS | Status: AC | PRN
  Administered 2020-11-17: 1 mL via INTRAVENOUS

## 2020-11-24 DIAGNOSIS — Z7189 Other specified counseling: Secondary | ICD-10-CM | POA: Diagnosis not present

## 2020-11-24 DIAGNOSIS — D225 Melanocytic nevi of trunk: Secondary | ICD-10-CM | POA: Diagnosis not present

## 2020-11-24 DIAGNOSIS — L448 Other specified papulosquamous disorders: Secondary | ICD-10-CM | POA: Diagnosis not present

## 2020-11-24 DIAGNOSIS — L821 Other seborrheic keratosis: Secondary | ICD-10-CM | POA: Diagnosis not present

## 2020-11-26 DIAGNOSIS — Z1231 Encounter for screening mammogram for malignant neoplasm of breast: Secondary | ICD-10-CM | POA: Diagnosis not present

## 2020-12-01 ENCOUNTER — Other Ambulatory Visit: Payer: Self-pay

## 2020-12-01 ENCOUNTER — Other Ambulatory Visit (HOSPITAL_BASED_OUTPATIENT_CLINIC_OR_DEPARTMENT_OTHER): Payer: Self-pay

## 2020-12-01 ENCOUNTER — Ambulatory Visit: Payer: Medicare Other | Attending: Internal Medicine

## 2020-12-01 DIAGNOSIS — Z23 Encounter for immunization: Secondary | ICD-10-CM

## 2020-12-01 MED ORDER — COVID-19 MRNA VACC (MODERNA) 100 MCG/0.5ML IM SUSP
INTRAMUSCULAR | 0 refills | Status: DC
  Filled 2020-12-01: qty 0.25, 1d supply, fill #0

## 2021-02-15 DIAGNOSIS — H04123 Dry eye syndrome of bilateral lacrimal glands: Secondary | ICD-10-CM | POA: Diagnosis not present

## 2021-02-26 DIAGNOSIS — Z23 Encounter for immunization: Secondary | ICD-10-CM | POA: Diagnosis not present

## 2021-02-26 DIAGNOSIS — I10 Essential (primary) hypertension: Secondary | ICD-10-CM | POA: Diagnosis not present

## 2021-02-26 DIAGNOSIS — E039 Hypothyroidism, unspecified: Secondary | ICD-10-CM | POA: Diagnosis not present

## 2021-04-25 NOTE — Progress Notes (Signed)
Cardiology Office Note:   Date:  04/26/2021  NAME:  Marella Vanderpol    MRN: [id number] DOB:  1940/10/06   PCP:  Crist Infante, MD  Cardiologist:  None  Electrophysiologist:  None   Referring MD: Crist Infante, MD   Chief Complaint  Patient presents with   Heart Murmur    History of Present Illness:   Alithia Zavaleta is a 80 y.o. female with a hx of hypothyroidism who is being seen today for the evaluation of murmur at the request of Crist Infante, MD. Echo in May with moderate basal septal hypertrophy. No significant valvular heart disease.  She presents with her husband.  She informs me that she does have a history of hypertension.  She also has hypothyroidism.  She takes medication for this.  She is never had a heart attack or stroke.  She reports that she exercises 5 days/week.  She does water aerobics.  40-minute sessions.  No chest pains or trouble breathing.  She is a former smoker of 20 years.  She quit years ago.  She was a housewife.  She now lives with her husband at [facility].  They seem to be doing quite well.  She was seen by her primary care physician and he heard a murmur.  Her echocardiogram showed no significant valvular heart disease but did show moderate basal septal hypertrophy.  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  She has no evidence of wall motion abnormality on her echocardiogram.  I suspect this is just related to blood pressure.  She has exceedingly high HDL cholesterol.  I suspect she will never have issues with her heart in her lifetime.  She reports she has children as well as grandchildren.  No major issues in office.  Labs reviewed from PCP total cholesterol 207, triglycerides 73, HDL 97, LDL 95  Past Medical History: Past Medical History:  Diagnosis Date   Hypertension    Thyroid disease     Past Surgical History: Past Surgical History:  Procedure Laterality Date   HIP ARTHROPLASTY Bilateral     Current  Medications: Current Meds  Medication Sig   amLODipine (NORVASC) 5 MG tablet Take 5 mg by mouth daily.   atorvastatin (LIPITOR) 10 MG tablet Take 10 mg by mouth every 3 (three) days.   COVID-19 mRNA vaccine, Moderna, 100 MCG/0.5ML injection Inject into the muscle.   irbesartan (AVAPRO) 300 MG tablet Take 300 mg by mouth daily.   levothyroxine (SYNTHROID) 88 MCG tablet Take by mouth.     Allergies:    Patient has no allergy information on record.   Social History: Social History   Socioeconomic History   Marital status: Married    Spouse name: Not on file   Number of children: Not on file   Years of education: Not on file   Highest education level: Not on file  Occupational History   Occupation: Housewife  Tobacco Use   Smoking status: Former    Packs/day: 1.00    Years: 20.00    Pack years: 20.00    Types: Cigarettes   Smokeless tobacco: Never  Substance and Sexual Activity   Alcohol use: Yes    Alcohol/week: 1.0 standard drink    Types: 1 Shots of liquor per week    Comment: occassional   Drug use: Never   Sexual activity: Not Currently  Other Topics Concern   Not on file  Social History Narrative   Not on file   Social  Determinants of Health   Financial Resource Strain: Not on file  Food Insecurity: Not on file  Transportation Needs: Not on file  Physical Activity: Not on file  Stress: Not on file  Social Connections: Not on file     Family History: The patient's family history is not on file.  ROS:   All other ROS reviewed and negative. Pertinent positives noted in the HPI.     EKGs/Labs/Other Studies Reviewed:   The following studies were personally reviewed by me today:  EKG:  EKG is ordered today.  The ekg ordered today demonstrates normal sinus rhythm heart 75, old anteroseptal infarct, and was personally reviewed by me.   TTE 11/17/2020  1. Left ventricular ejection fraction, by estimation, is 60 to 65%. The  left ventricle has normal function.  The left ventricle has no regional  wall motion abnormalities. There is moderate asymmetric left ventricular  hypertrophy of the basal-septal  segment. Left ventricular diastolic parameters were normal.   2. Right ventricular systolic function is normal. The right ventricular  size is normal. Tricuspid regurgitation signal is inadequate for assessing  PA pressure.   3. The mitral valve is grossly normal. Mild mitral valve regurgitation.  No evidence of mitral stenosis.   4. The aortic valve is grossly normal. Aortic valve regurgitation is  trivial. No aortic stenosis is present.   5. The inferior vena cava is normal in size with greater than 50%  respiratory variability, suggesting right atrial pressure of 3 mmHg.   Recent Labs: No results found for requested labs within last 8760 hours.   Recent Lipid Panel No results found for: CHOL, TRIG, HDL, CHOLHDL, VLDL, LDLCALC, LDLDIRECT  Physical Exam:   VS:  BP 130/76 (BP Location: Right Arm, Patient Position: Sitting, Cuff Size: Normal)   Pulse 75   Ht 5' (1.524 m)   Wt 136 lb 9.6 oz (62 kg)   SpO2 98%   BMI 26.68 kg/m    Wt Readings from Last 3 Encounters:  04/26/21 136 lb 9.6 oz (62 kg)    General: Well nourished, well developed, in no acute distress Head: Atraumatic, normal size  Eyes: PEERLA, EOMI  Neck: Supple, no JVD Endocrine: No thryomegaly Cardiac: Normal S1, S2; RRR; faint 2 out of 6 systolic ejection murmur Lungs: Clear to auscultation bilaterally, no wheezing, rhonchi or rales  Abd: Soft, nontender, no hepatomegaly  Ext: No edema, pulses 2+ Musculoskeletal: No deformities, BUE and BLE strength normal and equal Skin: Warm and dry, no rashes   Neuro: Alert and oriented to person, place, time, and situation, CNII-XII grossly intact, no focal deficits  Psych: Normal mood and affect   ASSESSMENT:   Kanani Mowbray is a 80 y.o. female who presents for the following: 1. Murmur     PLAN:   1. Murmur -She has a faint 2  out of 6 systolic ejection murmur.  This is a benign murmur.  She really has no evidence of significant valvular heart disease.  I suspect her murmur is just related to a small left ventricle in the setting of hypertension.  This is likely just a flow murmur.  Her echocardiogram does show moderate basal septal hypertrophy.  This is asymmetric however likely just related to blood pressure.  She does not meet criteria for hypertrophic cardiomyopathy.  Her EKG also shows an old anteroseptal infarct which she has not had.  Again these are changes suggestive of hypertension.  She has no symptoms.  She can exercise 5 days/week  without limitations.  I would recommend no further work-up.  I suspect this is just a benign flow murmur in the setting of a small LV with hypertension.  She will see [place] back as needed.  Disposition: Return if symptoms worsen or fail to improve.  Medication Adjustments/Labs and Tests Ordered: Current medicines are reviewed at length with the patient today.  Concerns regarding medicines are outlined above.  Orders Placed This Encounter  Procedures   EKG 12-Lead   No orders of the defined types were placed in this encounter.   Patient Instructions  Medication Instructions:  The current medical regimen is effective;  continue present plan and medications.  *If you need a refill on your cardiac medications before your next appointment, please call your pharmacy*  Follow-Up: At [facility], you and your health needs are our priority.  As part of our continuing mission to provide you with exceptional heart care, we have created designated Provider Care Teams.  These Care Teams include your primary Cardiologist (physician) and Advanced Practice Providers (APPs -  Physician Assistants and Nurse Practitioners) who all work together to provide you with the care you need, when you need it.  We recommend signing up for the patient portal called "MyChart".  Sign up information is  provided on this After Visit Summary.  MyChart is used to connect with patients for Virtual Visits (Telemedicine).  Patients are able to view lab/test results, encounter notes, upcoming appointments, etc.  Non-urgent messages can be sent to your provider as well.   To learn more about what you can do with MyChart, go to [email or web address].    Your next appointment:   As needed  The format for your next appointment:   In Person  Provider:   Eleonore Chiquito, MD     Signed, Addison Naegeli. Audie Box, MD, [place]  [address], [place], [place] [address] [phone]  04/26/2021 4:17 PM

## 2021-04-26 ENCOUNTER — Other Ambulatory Visit: Payer: Self-pay

## 2021-04-26 ENCOUNTER — Ambulatory Visit (INDEPENDENT_AMBULATORY_CARE_PROVIDER_SITE_OTHER): Payer: Medicare Other | Admitting: Cardiovascular Disease

## 2021-04-26 ENCOUNTER — Encounter: Payer: Self-pay | Admitting: Cardiovascular Disease

## 2021-04-26 VITALS — BP 130/76 | HR 75 | Ht 60.0 in | Wt 136.6 lb

## 2021-04-26 DIAGNOSIS — E039 Hypothyroidism, unspecified: Secondary | ICD-10-CM | POA: Diagnosis not present

## 2021-04-26 DIAGNOSIS — R011 Cardiac murmur, unspecified: Secondary | ICD-10-CM | POA: Diagnosis not present

## 2021-04-26 DIAGNOSIS — E785 Hyperlipidemia, unspecified: Secondary | ICD-10-CM | POA: Diagnosis not present

## 2021-04-26 DIAGNOSIS — R102 Pelvic and perineal pain: Secondary | ICD-10-CM | POA: Diagnosis not present

## 2021-04-26 DIAGNOSIS — E559 Vitamin D deficiency, unspecified: Secondary | ICD-10-CM | POA: Diagnosis not present

## 2021-04-26 DIAGNOSIS — M25551 Pain in right hip: Secondary | ICD-10-CM | POA: Diagnosis not present

## 2021-04-26 DIAGNOSIS — M5451 Vertebrogenic low back pain: Secondary | ICD-10-CM | POA: Diagnosis not present

## 2021-04-26 NOTE — Patient Instructions (Signed)
Medication Instructions:  The current medical regimen is effective;  continue present plan and medications.  *If you need a refill on your cardiac medications before your next appointment, please call your pharmacy*    Follow-Up: At [facility], you and your health needs are our priority.  As part of our continuing mission to provide you with exceptional heart care, we have created designated Provider Care Teams.  These Care Teams include your primary Cardiologist (physician) and Advanced Practice Providers (APPs -  Physician Assistants and Nurse Practitioners) who all work together to provide you with the care you need, when you need it.  We recommend signing up for the patient portal called "MyChart".  Sign up information is provided on this After Visit Summary.  MyChart is used to connect with patients for Virtual Visits (Telemedicine).  Patients are able to view lab/test results, encounter notes, upcoming appointments, etc.  Non-urgent messages can be sent to your provider as well.   To learn more about what you can do with MyChart, go to [email or web address].    Your next appointment:   As needed  The format for your next appointment:   In Person  Provider:   Orchid O'Neal, MD      

## 2021-05-03 ENCOUNTER — Other Ambulatory Visit: Payer: Self-pay | Admitting: Internal Medicine

## 2021-05-03 DIAGNOSIS — Z1331 Encounter for screening for depression: Secondary | ICD-10-CM | POA: Diagnosis not present

## 2021-05-03 DIAGNOSIS — Z23 Encounter for immunization: Secondary | ICD-10-CM | POA: Diagnosis not present

## 2021-05-03 DIAGNOSIS — E785 Hyperlipidemia, unspecified: Secondary | ICD-10-CM

## 2021-05-03 DIAGNOSIS — Z1389 Encounter for screening for other disorder: Secondary | ICD-10-CM | POA: Diagnosis not present

## 2021-05-03 DIAGNOSIS — M810 Age-related osteoporosis without current pathological fracture: Secondary | ICD-10-CM | POA: Diagnosis not present

## 2021-05-03 DIAGNOSIS — Z0000 Encounter for general adult medical examination without abnormal findings: Secondary | ICD-10-CM | POA: Diagnosis not present

## 2021-05-03 DIAGNOSIS — R82998 Other abnormal findings in urine: Secondary | ICD-10-CM | POA: Diagnosis not present

## 2021-05-03 DIAGNOSIS — E559 Vitamin D deficiency, unspecified: Secondary | ICD-10-CM | POA: Diagnosis not present

## 2021-05-03 DIAGNOSIS — E039 Hypothyroidism, unspecified: Secondary | ICD-10-CM | POA: Diagnosis not present

## 2021-05-03 DIAGNOSIS — M1990 Unspecified osteoarthritis, unspecified site: Secondary | ICD-10-CM | POA: Diagnosis not present

## 2021-05-03 DIAGNOSIS — I10 Essential (primary) hypertension: Secondary | ICD-10-CM | POA: Diagnosis not present

## 2021-05-03 DIAGNOSIS — R011 Cardiac murmur, unspecified: Secondary | ICD-10-CM | POA: Diagnosis not present

## 2021-05-04 DIAGNOSIS — M545 Low back pain, unspecified: Secondary | ICD-10-CM | POA: Diagnosis not present

## 2021-05-10 DIAGNOSIS — S76311D Strain of muscle, fascia and tendon of the posterior muscle group at thigh level, right thigh, subsequent encounter: Secondary | ICD-10-CM | POA: Diagnosis not present

## 2021-05-11 ENCOUNTER — Other Ambulatory Visit (HOSPITAL_COMMUNITY): Payer: Self-pay | Admitting: *Deleted

## 2021-05-12 ENCOUNTER — Ambulatory Visit (HOSPITAL_COMMUNITY)
Admission: RE | Admit: 2021-05-12 | Discharge: 2021-05-12 | Disposition: A | Discharge status: Home / Self Care | Payer: Medicare Other | Source: Ambulatory Visit | Attending: Internal Medicine | Admitting: Internal Medicine

## 2021-05-12 ENCOUNTER — Other Ambulatory Visit: Payer: Self-pay

## 2021-05-12 DIAGNOSIS — M810 Age-related osteoporosis without current pathological fracture: Secondary | ICD-10-CM | POA: Insufficient documentation

## 2021-05-12 DIAGNOSIS — S76311D Strain of muscle, fascia and tendon of the posterior muscle group at thigh level, right thigh, subsequent encounter: Secondary | ICD-10-CM | POA: Diagnosis not present

## 2021-05-12 DIAGNOSIS — S335XXD Sprain of ligaments of lumbar spine, subsequent encounter: Secondary | ICD-10-CM | POA: Diagnosis not present

## 2021-05-12 MED ORDER — DENOSUMAB 60 MG/ML ~~LOC~~ SOSY
PREFILLED_SYRINGE | SUBCUTANEOUS | Status: AC
  Administered 2021-05-12: 60 mg via SUBCUTANEOUS
  Filled 2021-05-12: qty 1

## 2021-05-12 MED ORDER — DENOSUMAB 60 MG/ML ~~LOC~~ SOSY: 60.0000 mg | PREFILLED_SYRINGE | Freq: Once | SUBCUTANEOUS | Status: AC

## 2021-05-16 DIAGNOSIS — Z1211 Encounter for screening for malignant neoplasm of colon: Secondary | ICD-10-CM | POA: Diagnosis not present

## 2021-05-17 DIAGNOSIS — S335XXD Sprain of ligaments of lumbar spine, subsequent encounter: Secondary | ICD-10-CM | POA: Diagnosis not present

## 2021-05-17 DIAGNOSIS — S76311D Strain of muscle, fascia and tendon of the posterior muscle group at thigh level, right thigh, subsequent encounter: Secondary | ICD-10-CM | POA: Diagnosis not present

## 2021-05-19 DIAGNOSIS — S76311D Strain of muscle, fascia and tendon of the posterior muscle group at thigh level, right thigh, subsequent encounter: Secondary | ICD-10-CM | POA: Diagnosis not present

## 2021-05-19 DIAGNOSIS — S335XXD Sprain of ligaments of lumbar spine, subsequent encounter: Secondary | ICD-10-CM | POA: Diagnosis not present

## 2021-05-24 ENCOUNTER — Ambulatory Visit
Admission: RE | Admit: 2021-05-24 | Discharge: 2021-05-24 | Disposition: A | Discharge status: Home / Self Care | Payer: No Typology Code available for payment source | Source: Ambulatory Visit | Attending: Internal Medicine | Admitting: Internal Medicine

## 2021-05-24 DIAGNOSIS — E785 Hyperlipidemia, unspecified: Secondary | ICD-10-CM

## 2021-05-24 DIAGNOSIS — S76311D Strain of muscle, fascia and tendon of the posterior muscle group at thigh level, right thigh, subsequent encounter: Secondary | ICD-10-CM | POA: Diagnosis not present

## 2021-05-24 DIAGNOSIS — S335XXD Sprain of ligaments of lumbar spine, subsequent encounter: Secondary | ICD-10-CM | POA: Diagnosis not present

## 2021-05-26 DIAGNOSIS — S335XXD Sprain of ligaments of lumbar spine, subsequent encounter: Secondary | ICD-10-CM | POA: Diagnosis not present

## 2021-05-26 DIAGNOSIS — S76311D Strain of muscle, fascia and tendon of the posterior muscle group at thigh level, right thigh, subsequent encounter: Secondary | ICD-10-CM | POA: Diagnosis not present

## 2021-06-01 DIAGNOSIS — S76311D Strain of muscle, fascia and tendon of the posterior muscle group at thigh level, right thigh, subsequent encounter: Secondary | ICD-10-CM | POA: Diagnosis not present

## 2021-06-01 DIAGNOSIS — S335XXD Sprain of ligaments of lumbar spine, subsequent encounter: Secondary | ICD-10-CM | POA: Diagnosis not present

## 2021-06-03 DIAGNOSIS — S76311D Strain of muscle, fascia and tendon of the posterior muscle group at thigh level, right thigh, subsequent encounter: Secondary | ICD-10-CM | POA: Diagnosis not present

## 2021-06-03 DIAGNOSIS — S335XXD Sprain of ligaments of lumbar spine, subsequent encounter: Secondary | ICD-10-CM | POA: Diagnosis not present

## 2021-06-08 DIAGNOSIS — S335XXD Sprain of ligaments of lumbar spine, subsequent encounter: Secondary | ICD-10-CM | POA: Diagnosis not present

## 2021-06-08 DIAGNOSIS — S76311D Strain of muscle, fascia and tendon of the posterior muscle group at thigh level, right thigh, subsequent encounter: Secondary | ICD-10-CM | POA: Diagnosis not present

## 2021-06-14 DIAGNOSIS — S335XXD Sprain of ligaments of lumbar spine, subsequent encounter: Secondary | ICD-10-CM | POA: Diagnosis not present

## 2021-06-14 DIAGNOSIS — S76311D Strain of muscle, fascia and tendon of the posterior muscle group at thigh level, right thigh, subsequent encounter: Secondary | ICD-10-CM | POA: Diagnosis not present

## 2021-06-15 DIAGNOSIS — M5441 Lumbago with sciatica, right side: Secondary | ICD-10-CM | POA: Diagnosis not present

## 2021-06-15 DIAGNOSIS — M25551 Pain in right hip: Secondary | ICD-10-CM | POA: Diagnosis not present

## 2021-06-22 DIAGNOSIS — S335XXD Sprain of ligaments of lumbar spine, subsequent encounter: Secondary | ICD-10-CM | POA: Diagnosis not present

## 2021-06-22 DIAGNOSIS — S76311D Strain of muscle, fascia and tendon of the posterior muscle group at thigh level, right thigh, subsequent encounter: Secondary | ICD-10-CM | POA: Diagnosis not present

## 2021-06-24 DIAGNOSIS — S335XXD Sprain of ligaments of lumbar spine, subsequent encounter: Secondary | ICD-10-CM | POA: Diagnosis not present

## 2021-06-24 DIAGNOSIS — S76311D Strain of muscle, fascia and tendon of the posterior muscle group at thigh level, right thigh, subsequent encounter: Secondary | ICD-10-CM | POA: Diagnosis not present

## 2021-06-28 DIAGNOSIS — S335XXD Sprain of ligaments of lumbar spine, subsequent encounter: Secondary | ICD-10-CM | POA: Diagnosis not present

## 2021-06-28 DIAGNOSIS — S76311D Strain of muscle, fascia and tendon of the posterior muscle group at thigh level, right thigh, subsequent encounter: Secondary | ICD-10-CM | POA: Diagnosis not present

## 2021-06-28 DIAGNOSIS — E039 Hypothyroidism, unspecified: Secondary | ICD-10-CM | POA: Diagnosis not present

## 2021-07-05 DIAGNOSIS — S335XXD Sprain of ligaments of lumbar spine, subsequent encounter: Secondary | ICD-10-CM | POA: Diagnosis not present

## 2021-07-05 DIAGNOSIS — S76311D Strain of muscle, fascia and tendon of the posterior muscle group at thigh level, right thigh, subsequent encounter: Secondary | ICD-10-CM | POA: Diagnosis not present

## 2021-07-07 DIAGNOSIS — S335XXD Sprain of ligaments of lumbar spine, subsequent encounter: Secondary | ICD-10-CM | POA: Diagnosis not present

## 2021-07-07 DIAGNOSIS — S76311D Strain of muscle, fascia and tendon of the posterior muscle group at thigh level, right thigh, subsequent encounter: Secondary | ICD-10-CM | POA: Diagnosis not present

## 2021-07-13 DIAGNOSIS — S76311D Strain of muscle, fascia and tendon of the posterior muscle group at thigh level, right thigh, subsequent encounter: Secondary | ICD-10-CM | POA: Diagnosis not present

## 2021-07-13 DIAGNOSIS — S335XXD Sprain of ligaments of lumbar spine, subsequent encounter: Secondary | ICD-10-CM | POA: Diagnosis not present

## 2021-07-14 ENCOUNTER — Ambulatory Visit: Payer: Medicare Other | Attending: Internal Medicine

## 2021-07-14 ENCOUNTER — Other Ambulatory Visit (HOSPITAL_BASED_OUTPATIENT_CLINIC_OR_DEPARTMENT_OTHER): Payer: Self-pay

## 2021-07-14 DIAGNOSIS — Z23 Encounter for immunization: Secondary | ICD-10-CM | POA: Diagnosis not present

## 2021-07-14 MED ORDER — MODERNA COVID-19 BIVAL BOOSTER 50 MCG/0.5ML IM SUSP
INTRAMUSCULAR | 0 refills | Status: DC
  Filled 2021-07-14: qty 0.5, 1d supply, fill #0

## 2021-07-14 NOTE — Progress Notes (Signed)
° °  Covid-19 Vaccination Clinic  Name:  Lauren Krueger    MRN: [id number] DOB: [birth year]  07/14/2021  Lauren Krueger was observed post Covid-19 immunization for 15 minutes without incident. She was provided with Vaccine Information Sheet and instruction to access the V-Safe system.   Lauren Krueger was instructed to call 911 with any severe reactions post vaccine: Difficulty breathing  Swelling of face and throat  A fast heartbeat  A bad rash all over body  Dizziness and weakness   Immunizations Administered     Name Date Dose VIS Date Route   Moderna Covid-19 vaccine Bivalent Booster 07/14/2021  1:46 PM 0.5 mL 02/27/2021 Intramuscular   Manufacturer: Levan Hurst   Lot: [id number]   [place]: [id number]

## 2021-07-27 DIAGNOSIS — M25561 Pain in right knee: Secondary | ICD-10-CM | POA: Diagnosis not present

## 2021-07-27 DIAGNOSIS — M25562 Pain in left knee: Secondary | ICD-10-CM | POA: Diagnosis not present

## 2021-08-16 DIAGNOSIS — R053 Chronic cough: Secondary | ICD-10-CM | POA: Diagnosis not present

## 2021-08-16 DIAGNOSIS — E039 Hypothyroidism, unspecified: Secondary | ICD-10-CM | POA: Diagnosis not present

## 2021-08-16 DIAGNOSIS — K219 Gastro-esophageal reflux disease without esophagitis: Secondary | ICD-10-CM | POA: Diagnosis not present

## 2021-08-16 DIAGNOSIS — I10 Essential (primary) hypertension: Secondary | ICD-10-CM | POA: Diagnosis not present

## 2021-08-16 DIAGNOSIS — I2510 Atherosclerotic heart disease of native coronary artery without angina pectoris: Secondary | ICD-10-CM | POA: Diagnosis not present

## 2021-08-23 ENCOUNTER — Other Ambulatory Visit (HOSPITAL_COMMUNITY): Payer: Self-pay

## 2021-08-24 DIAGNOSIS — E785 Hyperlipidemia, unspecified: Secondary | ICD-10-CM | POA: Diagnosis not present

## 2021-08-24 DIAGNOSIS — I10 Essential (primary) hypertension: Secondary | ICD-10-CM | POA: Diagnosis not present

## 2021-08-24 DIAGNOSIS — E039 Hypothyroidism, unspecified: Secondary | ICD-10-CM | POA: Diagnosis not present

## 2021-08-25 ENCOUNTER — Encounter (HOSPITAL_COMMUNITY)
Admission: RE | Admit: 2021-08-25 | Discharge: 2021-08-25 | Disposition: A | Discharge status: Home / Self Care | Payer: Medicare Other | Source: Ambulatory Visit | Attending: Internal Medicine | Admitting: Internal Medicine

## 2021-08-25 ENCOUNTER — Other Ambulatory Visit: Payer: Self-pay

## 2021-08-25 DIAGNOSIS — E785 Hyperlipidemia, unspecified: Secondary | ICD-10-CM | POA: Insufficient documentation

## 2021-08-25 MED ORDER — INCLISIRAN SODIUM 284 MG/1.5ML ~~LOC~~ SOSY
PREFILLED_SYRINGE | SUBCUTANEOUS | Status: AC
  Filled 2021-08-25: qty 1.5

## 2021-08-25 MED ORDER — INCLISIRAN SODIUM 284 MG/1.5ML ~~LOC~~ SOSY
284.0000 mg | PREFILLED_SYRINGE | Freq: Once | SUBCUTANEOUS | Status: DC
  Administered 2021-08-25: 284 mg via SUBCUTANEOUS

## 2021-10-05 DIAGNOSIS — M1712 Unilateral primary osteoarthritis, left knee: Secondary | ICD-10-CM | POA: Diagnosis not present

## 2021-10-05 DIAGNOSIS — M1711 Unilateral primary osteoarthritis, right knee: Secondary | ICD-10-CM | POA: Diagnosis not present

## 2021-10-06 DIAGNOSIS — Z961 Presence of intraocular lens: Secondary | ICD-10-CM | POA: Diagnosis not present

## 2021-10-06 DIAGNOSIS — H04123 Dry eye syndrome of bilateral lacrimal glands: Secondary | ICD-10-CM | POA: Diagnosis not present

## 2021-10-15 DIAGNOSIS — M1712 Unilateral primary osteoarthritis, left knee: Secondary | ICD-10-CM | POA: Diagnosis not present

## 2021-10-15 DIAGNOSIS — M1711 Unilateral primary osteoarthritis, right knee: Secondary | ICD-10-CM | POA: Diagnosis not present

## 2021-10-18 DIAGNOSIS — M1711 Unilateral primary osteoarthritis, right knee: Secondary | ICD-10-CM | POA: Diagnosis not present

## 2021-10-18 DIAGNOSIS — M1712 Unilateral primary osteoarthritis, left knee: Secondary | ICD-10-CM | POA: Diagnosis not present

## 2021-10-20 DIAGNOSIS — M1712 Unilateral primary osteoarthritis, left knee: Secondary | ICD-10-CM | POA: Diagnosis not present

## 2021-10-20 DIAGNOSIS — M1711 Unilateral primary osteoarthritis, right knee: Secondary | ICD-10-CM | POA: Diagnosis not present

## 2021-10-25 DIAGNOSIS — M1711 Unilateral primary osteoarthritis, right knee: Secondary | ICD-10-CM | POA: Diagnosis not present

## 2021-10-25 DIAGNOSIS — M1712 Unilateral primary osteoarthritis, left knee: Secondary | ICD-10-CM | POA: Diagnosis not present

## 2021-10-28 DIAGNOSIS — M1711 Unilateral primary osteoarthritis, right knee: Secondary | ICD-10-CM | POA: Diagnosis not present

## 2021-10-28 DIAGNOSIS — M1712 Unilateral primary osteoarthritis, left knee: Secondary | ICD-10-CM | POA: Diagnosis not present

## 2021-11-03 DIAGNOSIS — I10 Essential (primary) hypertension: Secondary | ICD-10-CM | POA: Diagnosis not present

## 2021-11-03 DIAGNOSIS — E785 Hyperlipidemia, unspecified: Secondary | ICD-10-CM | POA: Diagnosis not present

## 2021-11-03 DIAGNOSIS — E039 Hypothyroidism, unspecified: Secondary | ICD-10-CM | POA: Diagnosis not present

## 2021-11-04 DIAGNOSIS — M1712 Unilateral primary osteoarthritis, left knee: Secondary | ICD-10-CM | POA: Diagnosis not present

## 2021-11-04 DIAGNOSIS — M1711 Unilateral primary osteoarthritis, right knee: Secondary | ICD-10-CM | POA: Diagnosis not present

## 2021-11-08 DIAGNOSIS — K589 Irritable bowel syndrome without diarrhea: Secondary | ICD-10-CM | POA: Diagnosis not present

## 2021-11-08 DIAGNOSIS — E871 Hypo-osmolality and hyponatremia: Secondary | ICD-10-CM | POA: Diagnosis not present

## 2021-11-08 DIAGNOSIS — I2510 Atherosclerotic heart disease of native coronary artery without angina pectoris: Secondary | ICD-10-CM | POA: Diagnosis not present

## 2021-11-08 DIAGNOSIS — I10 Essential (primary) hypertension: Secondary | ICD-10-CM | POA: Diagnosis not present

## 2021-11-08 DIAGNOSIS — E785 Hyperlipidemia, unspecified: Secondary | ICD-10-CM | POA: Diagnosis not present

## 2021-11-08 DIAGNOSIS — M810 Age-related osteoporosis without current pathological fracture: Secondary | ICD-10-CM | POA: Diagnosis not present

## 2021-11-08 DIAGNOSIS — M1711 Unilateral primary osteoarthritis, right knee: Secondary | ICD-10-CM | POA: Diagnosis not present

## 2021-11-08 DIAGNOSIS — I422 Other hypertrophic cardiomyopathy: Secondary | ICD-10-CM | POA: Diagnosis not present

## 2021-11-08 DIAGNOSIS — N939 Abnormal uterine and vaginal bleeding, unspecified: Secondary | ICD-10-CM | POA: Diagnosis not present

## 2021-11-08 DIAGNOSIS — M1712 Unilateral primary osteoarthritis, left knee: Secondary | ICD-10-CM | POA: Diagnosis not present

## 2021-11-12 DIAGNOSIS — M1711 Unilateral primary osteoarthritis, right knee: Secondary | ICD-10-CM | POA: Diagnosis not present

## 2021-11-12 DIAGNOSIS — M1712 Unilateral primary osteoarthritis, left knee: Secondary | ICD-10-CM | POA: Diagnosis not present

## 2021-11-24 ENCOUNTER — Ambulatory Visit (HOSPITAL_COMMUNITY)
Admission: RE | Admit: 2021-11-24 | Discharge: 2021-11-24 | Disposition: A | Discharge status: Home / Self Care | Payer: Medicare Other | Source: Ambulatory Visit | Attending: Internal Medicine | Admitting: Internal Medicine

## 2021-11-24 DIAGNOSIS — E785 Hyperlipidemia, unspecified: Secondary | ICD-10-CM | POA: Insufficient documentation

## 2021-11-24 MED ORDER — INCLISIRAN SODIUM 284 MG/1.5ML ~~LOC~~ SOSY
284.0000 mg | PREFILLED_SYRINGE | Freq: Once | SUBCUTANEOUS | Status: AC
  Administered 2021-11-24: 284 mg via SUBCUTANEOUS

## 2021-11-24 MED ORDER — INCLISIRAN SODIUM 284 MG/1.5ML ~~LOC~~ SOSY
PREFILLED_SYRINGE | SUBCUTANEOUS | Status: AC
  Filled 2021-11-24: qty 1.5

## 2021-11-30 DIAGNOSIS — Z1231 Encounter for screening mammogram for malignant neoplasm of breast: Secondary | ICD-10-CM | POA: Diagnosis not present

## 2021-12-01 DIAGNOSIS — L821 Other seborrheic keratosis: Secondary | ICD-10-CM | POA: Diagnosis not present

## 2021-12-01 DIAGNOSIS — L814 Other melanin hyperpigmentation: Secondary | ICD-10-CM | POA: Diagnosis not present

## 2021-12-01 DIAGNOSIS — L570 Actinic keratosis: Secondary | ICD-10-CM | POA: Diagnosis not present

## 2021-12-01 DIAGNOSIS — D1801 Hemangioma of skin and subcutaneous tissue: Secondary | ICD-10-CM | POA: Diagnosis not present

## 2021-12-01 DIAGNOSIS — L820 Inflamed seborrheic keratosis: Secondary | ICD-10-CM | POA: Diagnosis not present

## 2022-02-03 ENCOUNTER — Telehealth: Payer: Self-pay | Admitting: Pharmacy Technician

## 2022-02-03 ENCOUNTER — Other Ambulatory Visit: Payer: Self-pay | Admitting: Pharmacy Technician

## 2022-02-03 NOTE — Telephone Encounter (Signed)
PROLIA : patient will be seen at [facility]

## 2022-02-08 ENCOUNTER — Ambulatory Visit (HOSPITAL_COMMUNITY)
Admission: RE | Admit: 2022-02-08 | Discharge: 2022-02-08 | Disposition: A | Discharge status: Home / Self Care | Payer: Medicare Other | Source: Ambulatory Visit | Attending: Internal Medicine | Admitting: Internal Medicine

## 2022-02-08 ENCOUNTER — Other Ambulatory Visit (HOSPITAL_COMMUNITY): Payer: Self-pay | Admitting: *Deleted

## 2022-02-08 DIAGNOSIS — M810 Age-related osteoporosis without current pathological fracture: Secondary | ICD-10-CM | POA: Diagnosis not present

## 2022-02-08 MED ORDER — DENOSUMAB 60 MG/ML ~~LOC~~ SOSY
60.0000 mg | PREFILLED_SYRINGE | Freq: Once | SUBCUTANEOUS | Status: AC
  Administered 2022-02-08: 60 mg via SUBCUTANEOUS

## 2022-02-08 MED ORDER — DENOSUMAB 60 MG/ML ~~LOC~~ SOSY
PREFILLED_SYRINGE | SUBCUTANEOUS | Status: AC
  Filled 2022-02-08: qty 1

## 2022-02-09 ENCOUNTER — Encounter (HOSPITAL_COMMUNITY): Payer: Medicare Other

## 2022-03-23 DIAGNOSIS — H1032 Unspecified acute conjunctivitis, left eye: Secondary | ICD-10-CM | POA: Diagnosis not present

## 2022-03-23 DIAGNOSIS — H5712 Ocular pain, left eye: Secondary | ICD-10-CM | POA: Diagnosis not present

## 2022-03-25 DIAGNOSIS — D23122 Other benign neoplasm of skin of left lower eyelid, including canthus: Secondary | ICD-10-CM | POA: Diagnosis not present

## 2022-03-25 DIAGNOSIS — H16202 Unspecified keratoconjunctivitis, left eye: Secondary | ICD-10-CM | POA: Diagnosis not present

## 2022-04-30 DIAGNOSIS — Z23 Encounter for immunization: Secondary | ICD-10-CM | POA: Diagnosis not present

## 2022-05-03 DIAGNOSIS — I2510 Atherosclerotic heart disease of native coronary artery without angina pectoris: Secondary | ICD-10-CM | POA: Diagnosis not present

## 2022-05-03 DIAGNOSIS — E785 Hyperlipidemia, unspecified: Secondary | ICD-10-CM | POA: Diagnosis not present

## 2022-05-03 DIAGNOSIS — I10 Essential (primary) hypertension: Secondary | ICD-10-CM | POA: Diagnosis not present

## 2022-05-04 DIAGNOSIS — L814 Other melanin hyperpigmentation: Secondary | ICD-10-CM | POA: Diagnosis not present

## 2022-05-04 DIAGNOSIS — L821 Other seborrheic keratosis: Secondary | ICD-10-CM | POA: Diagnosis not present

## 2022-05-04 DIAGNOSIS — L570 Actinic keratosis: Secondary | ICD-10-CM | POA: Diagnosis not present

## 2022-05-11 ENCOUNTER — Encounter (HOSPITAL_COMMUNITY): Payer: Medicare Other

## 2022-05-23 DIAGNOSIS — I10 Essential (primary) hypertension: Secondary | ICD-10-CM | POA: Diagnosis not present

## 2022-05-23 DIAGNOSIS — I2510 Atherosclerotic heart disease of native coronary artery without angina pectoris: Secondary | ICD-10-CM | POA: Diagnosis not present

## 2022-05-23 DIAGNOSIS — E785 Hyperlipidemia, unspecified: Secondary | ICD-10-CM | POA: Diagnosis not present

## 2022-05-26 ENCOUNTER — Other Ambulatory Visit (HOSPITAL_COMMUNITY): Payer: Self-pay

## 2022-05-26 DIAGNOSIS — I10 Essential (primary) hypertension: Secondary | ICD-10-CM | POA: Diagnosis not present

## 2022-05-26 DIAGNOSIS — E559 Vitamin D deficiency, unspecified: Secondary | ICD-10-CM | POA: Diagnosis not present

## 2022-05-26 DIAGNOSIS — E039 Hypothyroidism, unspecified: Secondary | ICD-10-CM | POA: Diagnosis not present

## 2022-05-26 DIAGNOSIS — R7989 Other specified abnormal findings of blood chemistry: Secondary | ICD-10-CM | POA: Diagnosis not present

## 2022-05-26 DIAGNOSIS — E785 Hyperlipidemia, unspecified: Secondary | ICD-10-CM | POA: Diagnosis not present

## 2022-05-27 ENCOUNTER — Ambulatory Visit (HOSPITAL_COMMUNITY)
Admission: RE | Admit: 2022-05-27 | Discharge: 2022-05-27 | Disposition: A | Discharge status: Home / Self Care | Payer: Medicare Other | Source: Ambulatory Visit | Attending: Internal Medicine | Admitting: Internal Medicine

## 2022-05-27 DIAGNOSIS — E785 Hyperlipidemia, unspecified: Secondary | ICD-10-CM | POA: Insufficient documentation

## 2022-05-27 MED ORDER — INCLISIRAN SODIUM 284 MG/1.5ML ~~LOC~~ SOSY
PREFILLED_SYRINGE | SUBCUTANEOUS | Status: AC
  Administered 2022-05-27: 284 mg via SUBCUTANEOUS
  Filled 2022-05-27: qty 1.5

## 2022-05-27 MED ORDER — INCLISIRAN SODIUM 284 MG/1.5ML ~~LOC~~ SOSY: 284.0000 mg | PREFILLED_SYRINGE | Freq: Once | SUBCUTANEOUS | Status: AC

## 2022-05-31 DIAGNOSIS — I2510 Atherosclerotic heart disease of native coronary artery without angina pectoris: Secondary | ICD-10-CM | POA: Diagnosis not present

## 2022-05-31 DIAGNOSIS — E785 Hyperlipidemia, unspecified: Secondary | ICD-10-CM | POA: Diagnosis not present

## 2022-05-31 DIAGNOSIS — R82998 Other abnormal findings in urine: Secondary | ICD-10-CM | POA: Diagnosis not present

## 2022-05-31 DIAGNOSIS — M810 Age-related osteoporosis without current pathological fracture: Secondary | ICD-10-CM | POA: Diagnosis not present

## 2022-05-31 DIAGNOSIS — R413 Other amnesia: Secondary | ICD-10-CM | POA: Diagnosis not present

## 2022-05-31 DIAGNOSIS — M1990 Unspecified osteoarthritis, unspecified site: Secondary | ICD-10-CM | POA: Diagnosis not present

## 2022-05-31 DIAGNOSIS — Z0000 Encounter for general adult medical examination without abnormal findings: Secondary | ICD-10-CM | POA: Diagnosis not present

## 2022-05-31 DIAGNOSIS — I10 Essential (primary) hypertension: Secondary | ICD-10-CM | POA: Diagnosis not present

## 2022-05-31 DIAGNOSIS — R011 Cardiac murmur, unspecified: Secondary | ICD-10-CM | POA: Diagnosis not present

## 2022-05-31 DIAGNOSIS — I422 Other hypertrophic cardiomyopathy: Secondary | ICD-10-CM | POA: Diagnosis not present

## 2022-05-31 DIAGNOSIS — Z1339 Encounter for screening examination for other mental health and behavioral disorders: Secondary | ICD-10-CM | POA: Diagnosis not present

## 2022-05-31 DIAGNOSIS — Z1331 Encounter for screening for depression: Secondary | ICD-10-CM | POA: Diagnosis not present

## 2022-06-10 ENCOUNTER — Other Ambulatory Visit (HOSPITAL_BASED_OUTPATIENT_CLINIC_OR_DEPARTMENT_OTHER): Payer: Self-pay

## 2022-06-28 DIAGNOSIS — R4181 Age-related cognitive decline: Secondary | ICD-10-CM | POA: Diagnosis not present

## 2022-06-28 DIAGNOSIS — M810 Age-related osteoporosis without current pathological fracture: Secondary | ICD-10-CM | POA: Diagnosis not present

## 2022-06-28 DIAGNOSIS — I10 Essential (primary) hypertension: Secondary | ICD-10-CM | POA: Diagnosis not present

## 2022-06-28 DIAGNOSIS — R413 Other amnesia: Secondary | ICD-10-CM | POA: Diagnosis not present

## 2022-07-06 DIAGNOSIS — L814 Other melanin hyperpigmentation: Secondary | ICD-10-CM | POA: Diagnosis not present

## 2022-07-06 DIAGNOSIS — L570 Actinic keratosis: Secondary | ICD-10-CM | POA: Diagnosis not present

## 2022-07-06 DIAGNOSIS — L821 Other seborrheic keratosis: Secondary | ICD-10-CM | POA: Diagnosis not present

## 2022-07-06 DIAGNOSIS — L820 Inflamed seborrheic keratosis: Secondary | ICD-10-CM | POA: Diagnosis not present

## 2022-08-08 ENCOUNTER — Other Ambulatory Visit (HOSPITAL_COMMUNITY): Payer: Self-pay | Admitting: *Deleted

## 2022-08-11 ENCOUNTER — Ambulatory Visit (HOSPITAL_COMMUNITY)
Admission: RE | Admit: 2022-08-11 | Discharge: 2022-08-11 | Disposition: A | Discharge status: Home / Self Care | Payer: Medicare Other | Source: Ambulatory Visit | Attending: Internal Medicine | Admitting: Internal Medicine

## 2022-08-11 DIAGNOSIS — M810 Age-related osteoporosis without current pathological fracture: Secondary | ICD-10-CM | POA: Diagnosis not present

## 2022-08-11 MED ORDER — DENOSUMAB 60 MG/ML ~~LOC~~ SOSY
PREFILLED_SYRINGE | SUBCUTANEOUS | Status: AC
  Filled 2022-08-11: qty 1

## 2022-08-11 MED ORDER — DENOSUMAB 60 MG/ML ~~LOC~~ SOSY
60.0000 mg | PREFILLED_SYRINGE | Freq: Once | SUBCUTANEOUS | Status: AC
  Administered 2022-08-11: 60 mg via SUBCUTANEOUS

## 2022-09-07 DIAGNOSIS — D1801 Hemangioma of skin and subcutaneous tissue: Secondary | ICD-10-CM | POA: Diagnosis not present

## 2022-09-07 DIAGNOSIS — L821 Other seborrheic keratosis: Secondary | ICD-10-CM | POA: Diagnosis not present

## 2022-09-07 DIAGNOSIS — L814 Other melanin hyperpigmentation: Secondary | ICD-10-CM | POA: Diagnosis not present

## 2022-09-07 DIAGNOSIS — L570 Actinic keratosis: Secondary | ICD-10-CM | POA: Diagnosis not present

## 2022-10-31 DIAGNOSIS — H04123 Dry eye syndrome of bilateral lacrimal glands: Secondary | ICD-10-CM | POA: Diagnosis not present

## 2022-10-31 DIAGNOSIS — H52203 Unspecified astigmatism, bilateral: Secondary | ICD-10-CM | POA: Diagnosis not present

## 2022-10-31 DIAGNOSIS — Z961 Presence of intraocular lens: Secondary | ICD-10-CM | POA: Diagnosis not present

## 2022-11-23 ENCOUNTER — Other Ambulatory Visit (HOSPITAL_COMMUNITY): Payer: Self-pay | Admitting: *Deleted

## 2022-11-25 ENCOUNTER — Ambulatory Visit (HOSPITAL_COMMUNITY)
Admission: RE | Admit: 2022-11-25 | Discharge: 2022-11-25 | Disposition: A | Discharge status: Home / Self Care | Payer: Medicare Other | Source: Ambulatory Visit | Attending: Internal Medicine | Admitting: Internal Medicine

## 2022-11-25 DIAGNOSIS — E785 Hyperlipidemia, unspecified: Secondary | ICD-10-CM | POA: Insufficient documentation

## 2022-11-25 MED ORDER — INCLISIRAN SODIUM 284 MG/1.5ML ~~LOC~~ SOSY: 284.0000 mg | PREFILLED_SYRINGE | Freq: Once | SUBCUTANEOUS | Status: AC

## 2022-11-25 MED ORDER — INCLISIRAN SODIUM 284 MG/1.5ML ~~LOC~~ SOSY
PREFILLED_SYRINGE | SUBCUTANEOUS | Status: AC
  Administered 2022-11-25: 284 mg via SUBCUTANEOUS
  Filled 2022-11-25: qty 1.5

## 2022-11-30 DIAGNOSIS — L853 Xerosis cutis: Secondary | ICD-10-CM | POA: Diagnosis not present

## 2022-11-30 DIAGNOSIS — D1801 Hemangioma of skin and subcutaneous tissue: Secondary | ICD-10-CM | POA: Diagnosis not present

## 2022-11-30 DIAGNOSIS — L821 Other seborrheic keratosis: Secondary | ICD-10-CM | POA: Diagnosis not present

## 2022-11-30 DIAGNOSIS — L814 Other melanin hyperpigmentation: Secondary | ICD-10-CM | POA: Diagnosis not present

## 2022-12-06 DIAGNOSIS — Z1231 Encounter for screening mammogram for malignant neoplasm of breast: Secondary | ICD-10-CM | POA: Diagnosis not present

## 2023-01-30 DIAGNOSIS — I422 Other hypertrophic cardiomyopathy: Secondary | ICD-10-CM | POA: Diagnosis not present

## 2023-01-30 DIAGNOSIS — M810 Age-related osteoporosis without current pathological fracture: Secondary | ICD-10-CM | POA: Diagnosis not present

## 2023-01-30 DIAGNOSIS — G252 Other specified forms of tremor: Secondary | ICD-10-CM | POA: Diagnosis not present

## 2023-01-30 DIAGNOSIS — I2510 Atherosclerotic heart disease of native coronary artery without angina pectoris: Secondary | ICD-10-CM | POA: Diagnosis not present

## 2023-01-30 DIAGNOSIS — H3530 Unspecified macular degeneration: Secondary | ICD-10-CM | POA: Diagnosis not present

## 2023-01-30 DIAGNOSIS — R011 Cardiac murmur, unspecified: Secondary | ICD-10-CM | POA: Diagnosis not present

## 2023-01-30 DIAGNOSIS — M1990 Unspecified osteoarthritis, unspecified site: Secondary | ICD-10-CM | POA: Diagnosis not present

## 2023-01-30 DIAGNOSIS — K589 Irritable bowel syndrome without diarrhea: Secondary | ICD-10-CM | POA: Diagnosis not present

## 2023-01-30 DIAGNOSIS — E785 Hyperlipidemia, unspecified: Secondary | ICD-10-CM | POA: Diagnosis not present

## 2023-01-30 DIAGNOSIS — E871 Hypo-osmolality and hyponatremia: Secondary | ICD-10-CM | POA: Diagnosis not present

## 2023-01-30 DIAGNOSIS — I10 Essential (primary) hypertension: Secondary | ICD-10-CM | POA: Diagnosis not present

## 2023-01-30 DIAGNOSIS — E039 Hypothyroidism, unspecified: Secondary | ICD-10-CM | POA: Diagnosis not present

## 2023-02-09 ENCOUNTER — Encounter (HOSPITAL_COMMUNITY): Payer: Medicare Other

## 2023-02-10 ENCOUNTER — Other Ambulatory Visit (HOSPITAL_COMMUNITY): Payer: Self-pay | Admitting: *Deleted

## 2023-02-14 ENCOUNTER — Ambulatory Visit (HOSPITAL_COMMUNITY)
Admission: RE | Admit: 2023-02-14 | Discharge: 2023-02-14 | Disposition: A | Discharge status: Home / Self Care | Payer: Medicare Other | Source: Ambulatory Visit | Attending: Internal Medicine | Admitting: Internal Medicine

## 2023-02-14 DIAGNOSIS — M810 Age-related osteoporosis without current pathological fracture: Secondary | ICD-10-CM | POA: Insufficient documentation

## 2023-02-14 MED ORDER — DENOSUMAB 60 MG/ML ~~LOC~~ SOSY
60.0000 mg | PREFILLED_SYRINGE | Freq: Once | SUBCUTANEOUS | Status: AC
  Administered 2023-02-14: 60 mg via SUBCUTANEOUS

## 2023-02-14 MED ORDER — DENOSUMAB 60 MG/ML ~~LOC~~ SOSY
PREFILLED_SYRINGE | SUBCUTANEOUS | Status: AC
  Filled 2023-02-14: qty 1

## 2023-04-05 DIAGNOSIS — L821 Other seborrheic keratosis: Secondary | ICD-10-CM | POA: Diagnosis not present

## 2023-04-05 DIAGNOSIS — L814 Other melanin hyperpigmentation: Secondary | ICD-10-CM | POA: Diagnosis not present

## 2023-05-06 DIAGNOSIS — Z23 Encounter for immunization: Secondary | ICD-10-CM | POA: Diagnosis not present

## 2023-05-26 ENCOUNTER — Encounter (HOSPITAL_COMMUNITY): Payer: Medicare Other

## 2023-05-26 ENCOUNTER — Other Ambulatory Visit (HOSPITAL_COMMUNITY): Payer: Self-pay | Admitting: *Deleted

## 2023-05-29 ENCOUNTER — Ambulatory Visit (HOSPITAL_COMMUNITY)
Admission: RE | Admit: 2023-05-29 | Discharge: 2023-05-29 | Disposition: A | Discharge status: Home / Self Care | Payer: Medicare Other | Source: Ambulatory Visit | Attending: Internal Medicine | Admitting: Internal Medicine

## 2023-05-29 DIAGNOSIS — M810 Age-related osteoporosis without current pathological fracture: Secondary | ICD-10-CM | POA: Diagnosis not present

## 2023-05-29 MED ORDER — INCLISIRAN SODIUM 284 MG/1.5ML ~~LOC~~ SOSY
PREFILLED_SYRINGE | SUBCUTANEOUS | Status: AC
  Filled 2023-05-29: qty 1.5

## 2023-05-29 MED ORDER — INCLISIRAN SODIUM 284 MG/1.5ML ~~LOC~~ SOSY
284.0000 mg | PREFILLED_SYRINGE | Freq: Once | SUBCUTANEOUS | Status: AC
  Administered 2023-05-29: 284 mg via SUBCUTANEOUS

## 2023-06-27 ENCOUNTER — Other Ambulatory Visit (HOSPITAL_BASED_OUTPATIENT_CLINIC_OR_DEPARTMENT_OTHER): Payer: Self-pay

## 2023-06-27 DIAGNOSIS — Z23 Encounter for immunization: Secondary | ICD-10-CM | POA: Diagnosis not present

## 2023-06-27 MED ORDER — COVID-19 MRNA VAC-TRIS(PFIZER) 30 MCG/0.3ML IM SUSY
0.3000 mL | PREFILLED_SYRINGE | Freq: Once | INTRAMUSCULAR | 0 refills | Status: AC
  Filled 2023-06-27: qty 0.3, 1d supply, fill #0

## 2023-07-03 IMAGING — CT CT CARDIAC CORONARY ARTERY CALCIUM SCORE
3 series · 13 of 20 positions shown, 15 images · non-contrast
Comparison: None.

CLINICAL DATA: 80-year-old Caucasian female with history of
hyperlipidemia, hypertension and smoking history.

EXAM:
CT CARDIAC CORONARY ARTERY CALCIUM SCORE
TECHNIQUE: Non-contrast imaging through the heart was performed using
prospective ECG gating. Image post processing was performed on an
independent workstation, allowing for quantitative analysis of the
heart and coronary arteries. Note that this exam targets the heart
and the chest was not imaged in its entirety.

[Series 2: calcium scoring 2.00 qr36 bestdiast 69% hrt calciu · axial · 0.35mm/px · z∈[+1628,+1684]mm · 3 of 70 slices shown]
[im 14/70  vessel]
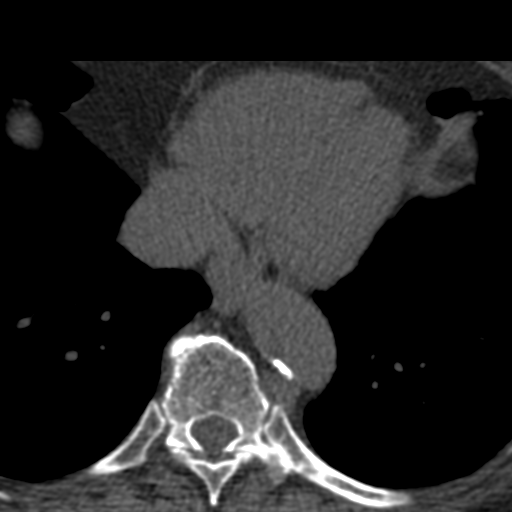
[im 28/70  vessel]
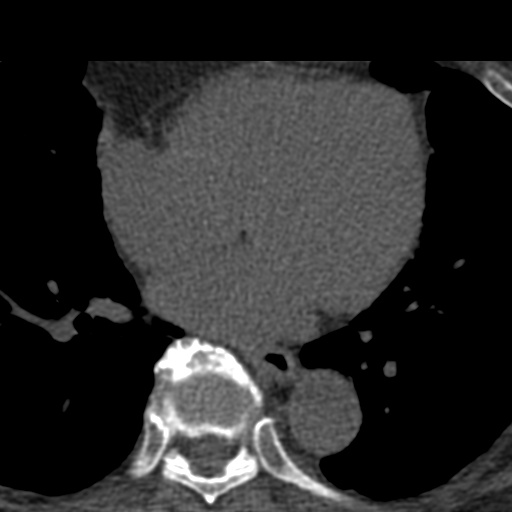
[im 42/70  vessel]
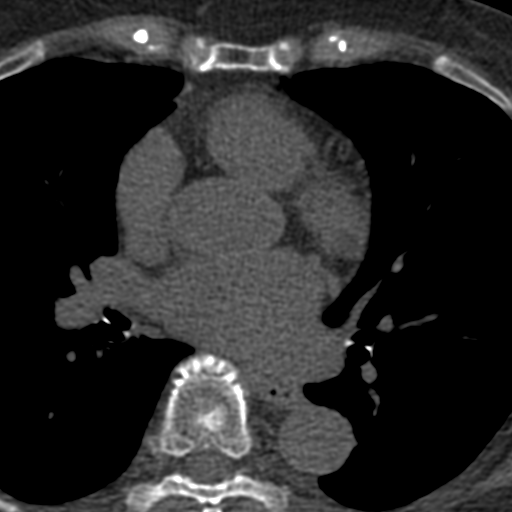

[Series 3: calcium scoring 2.00 br40 bestdiast 69% axial · axial · 0.47mm/px · z∈[+1624,+1716]mm · 5 of 70 slices shown, 7 images]
[im 12/70  vessel]
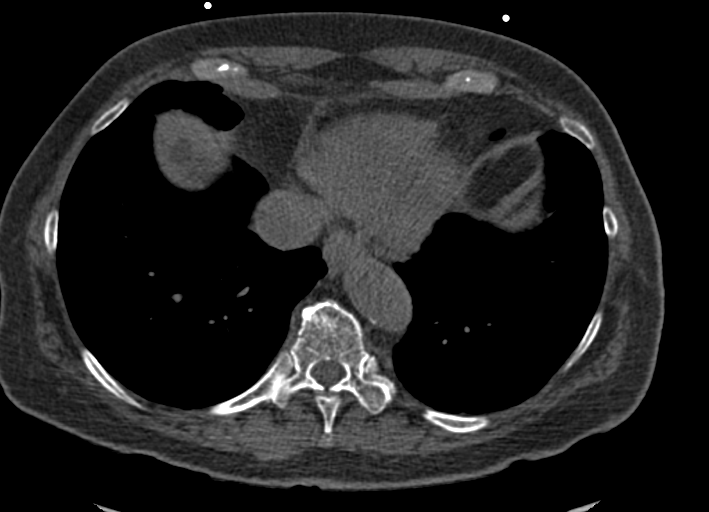
[im 12/70  lung]
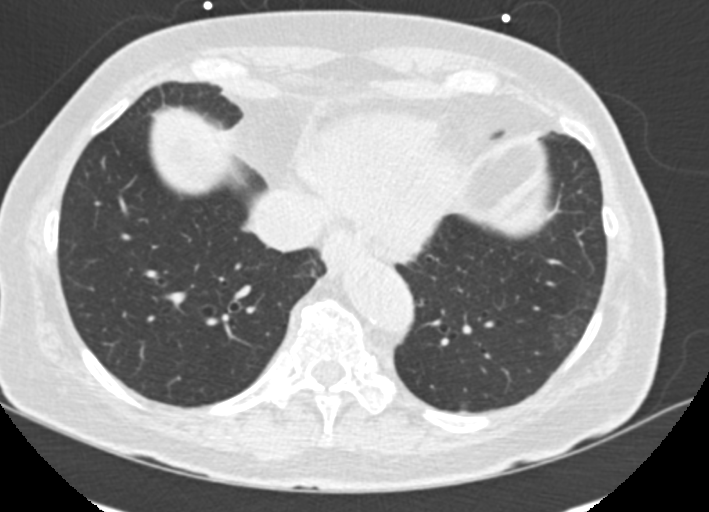
[im 24/70  vessel]
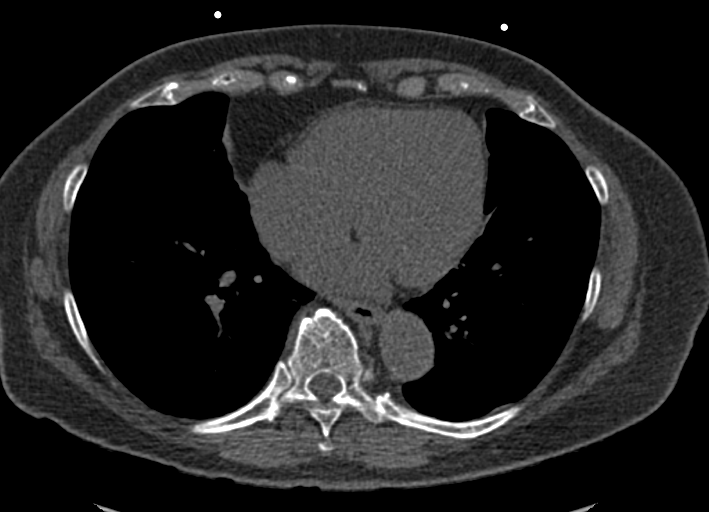
[im 35/70  vessel]
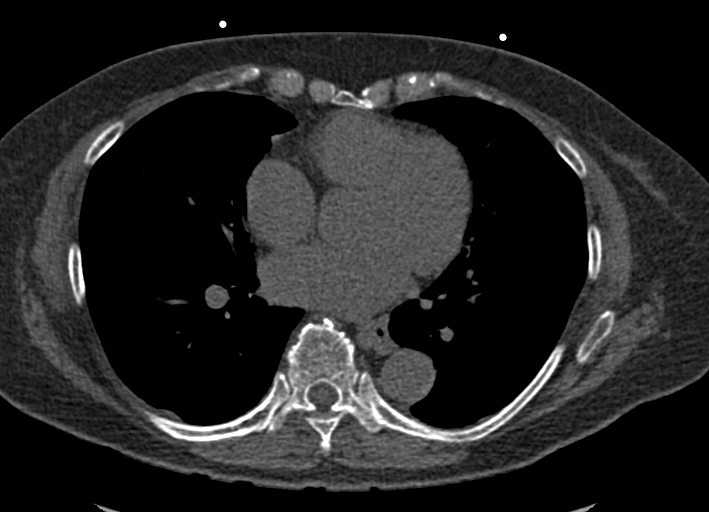
[im 47/70  vessel]
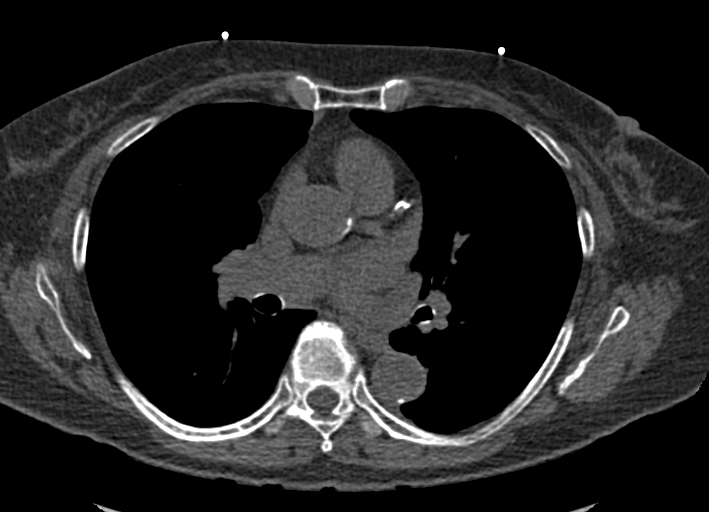
[im 58/70  vessel]
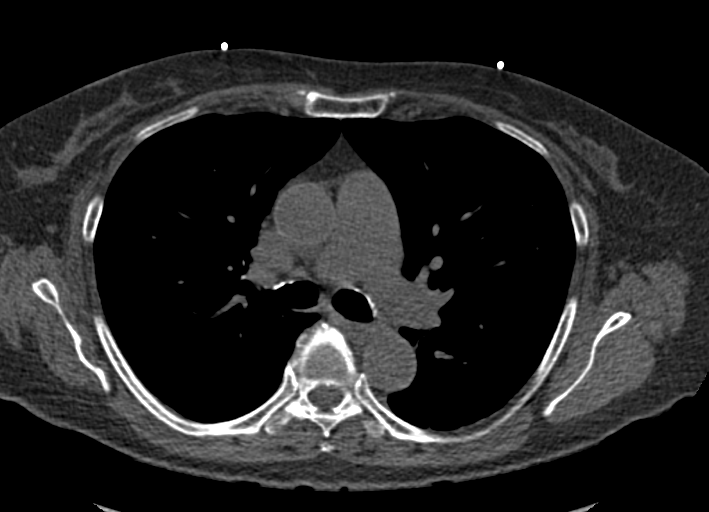
[im 58/70  lung]
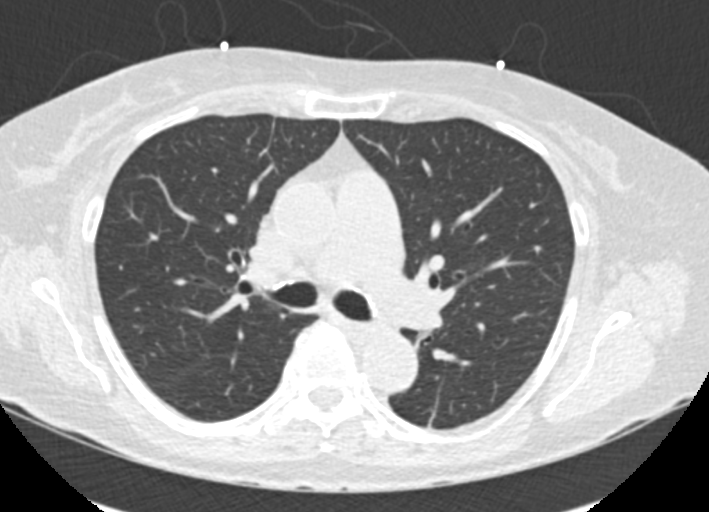

[Series 9: calcium scoring 2.00 br60 bestdiast 69% lungs · axial · 0.47mm/px · z∈[+1624,+1716]mm · 5 of 70 slices shown]
[im 12/70  vessel]
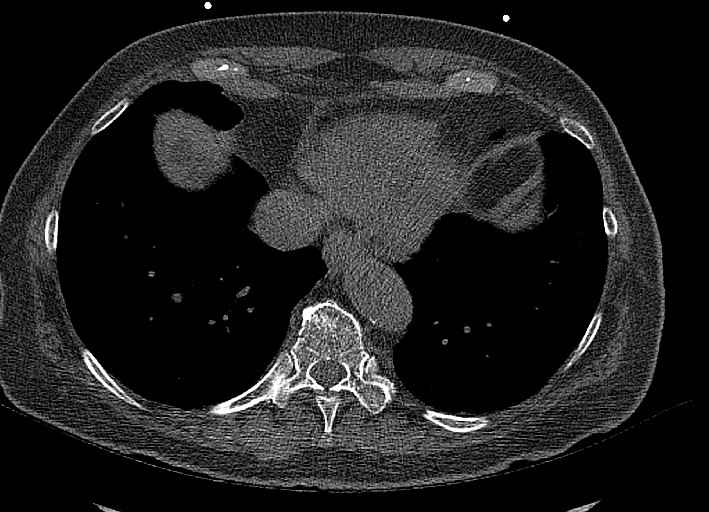
[im 24/70  vessel]
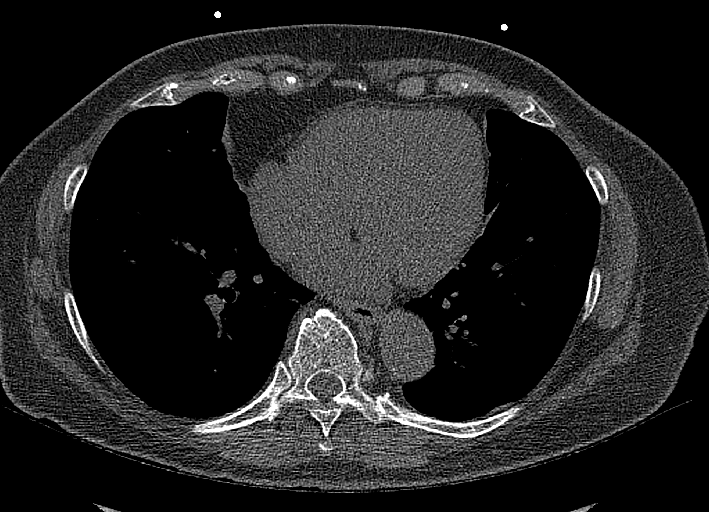
[im 35/70  vessel]
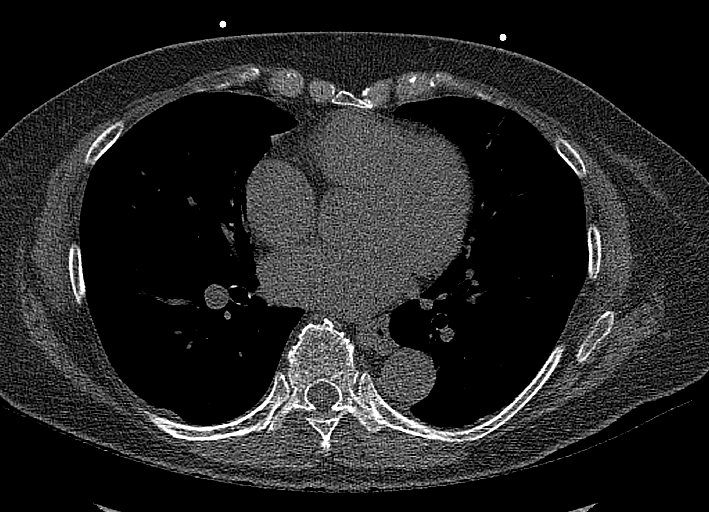
[im 47/70  vessel]
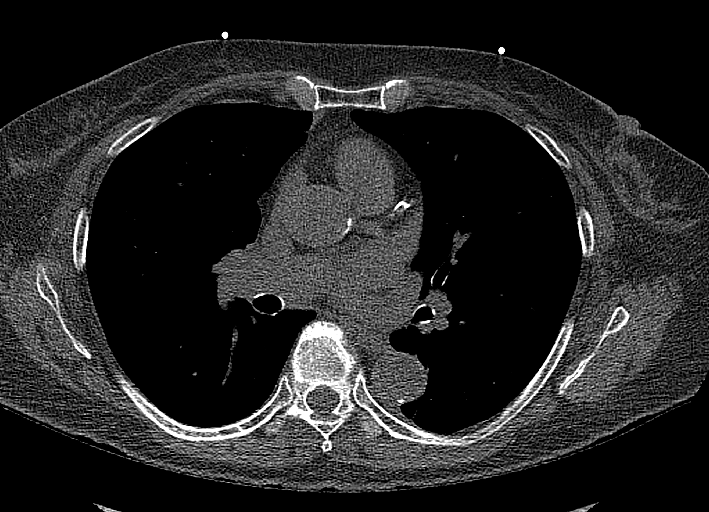
[im 58/70  vessel]
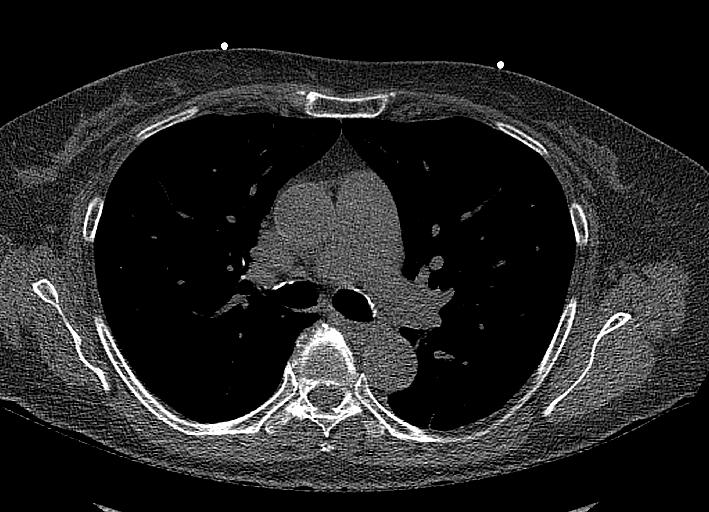

[13 of 20 positions shown; findings below may reference images not displayed]

FINDINGS: CORONARY CALCIUM SCORES:

Left Main: 16

LAD: 157

LCx: 0

RCA: 0

Total Agatston Score: 173

[HOSPITAL] percentile: 55

AORTA MEASUREMENTS:

Ascending Aorta: 30 mm

Descending Aorta: 24 mm

OTHER FINDINGS:

The heart size is within normal limits. No pericardial fluid is
identified. There is atherosclerosis of the thoracic aorta.
Visualized segments of the thoracic aorta and central pulmonary
arteries are normal in caliber. Visualized mediastinum and hilar
regions demonstrate no lymphadenopathy or masses. Visualized lungs
show no evidence of pulmonary edema, consolidation, pneumothorax,
nodule or pleural fluid. Visualized liver demonstrates multiple
cysts which have a benign appearance. Visualized bony structures are
unremarkable.
IMPRESSION: 1. Coronary calcium score of 173 is at the 55th percentile for the
patient's age, sex and race.
2. Benign-appearing hepatic cysts.

## 2023-07-26 DIAGNOSIS — E785 Hyperlipidemia, unspecified: Secondary | ICD-10-CM | POA: Diagnosis not present

## 2023-07-26 DIAGNOSIS — I2510 Atherosclerotic heart disease of native coronary artery without angina pectoris: Secondary | ICD-10-CM | POA: Diagnosis not present

## 2023-07-26 DIAGNOSIS — I10 Essential (primary) hypertension: Secondary | ICD-10-CM | POA: Diagnosis not present

## 2023-08-15 DIAGNOSIS — M810 Age-related osteoporosis without current pathological fracture: Secondary | ICD-10-CM | POA: Diagnosis not present

## 2023-08-15 DIAGNOSIS — E559 Vitamin D deficiency, unspecified: Secondary | ICD-10-CM | POA: Diagnosis not present

## 2023-08-15 DIAGNOSIS — E871 Hypo-osmolality and hyponatremia: Secondary | ICD-10-CM | POA: Diagnosis not present

## 2023-08-15 DIAGNOSIS — E039 Hypothyroidism, unspecified: Secondary | ICD-10-CM | POA: Diagnosis not present

## 2023-08-15 DIAGNOSIS — E785 Hyperlipidemia, unspecified: Secondary | ICD-10-CM | POA: Diagnosis not present

## 2023-08-15 DIAGNOSIS — I10 Essential (primary) hypertension: Secondary | ICD-10-CM | POA: Diagnosis not present

## 2023-08-22 DIAGNOSIS — M810 Age-related osteoporosis without current pathological fracture: Secondary | ICD-10-CM | POA: Diagnosis not present

## 2023-08-22 DIAGNOSIS — I422 Other hypertrophic cardiomyopathy: Secondary | ICD-10-CM | POA: Diagnosis not present

## 2023-08-22 DIAGNOSIS — Z0000 Encounter for general adult medical examination without abnormal findings: Secondary | ICD-10-CM | POA: Diagnosis not present

## 2023-08-22 DIAGNOSIS — M1990 Unspecified osteoarthritis, unspecified site: Secondary | ICD-10-CM | POA: Diagnosis not present

## 2023-08-22 DIAGNOSIS — R4181 Age-related cognitive decline: Secondary | ICD-10-CM | POA: Diagnosis not present

## 2023-08-22 DIAGNOSIS — I2510 Atherosclerotic heart disease of native coronary artery without angina pectoris: Secondary | ICD-10-CM | POA: Diagnosis not present

## 2023-08-22 DIAGNOSIS — K589 Irritable bowel syndrome without diarrhea: Secondary | ICD-10-CM | POA: Diagnosis not present

## 2023-08-22 DIAGNOSIS — H3530 Unspecified macular degeneration: Secondary | ICD-10-CM | POA: Diagnosis not present

## 2023-08-22 DIAGNOSIS — I10 Essential (primary) hypertension: Secondary | ICD-10-CM | POA: Diagnosis not present

## 2023-08-22 DIAGNOSIS — G252 Other specified forms of tremor: Secondary | ICD-10-CM | POA: Diagnosis not present

## 2023-08-22 DIAGNOSIS — E039 Hypothyroidism, unspecified: Secondary | ICD-10-CM | POA: Diagnosis not present

## 2023-08-22 DIAGNOSIS — E785 Hyperlipidemia, unspecified: Secondary | ICD-10-CM | POA: Diagnosis not present

## 2023-09-11 ENCOUNTER — Encounter: Payer: Self-pay | Admitting: Diagnostic Neuroimaging

## 2023-09-11 ENCOUNTER — Ambulatory Visit (INDEPENDENT_AMBULATORY_CARE_PROVIDER_SITE_OTHER): Payer: Medicare Other | Admitting: Diagnostic Neuroimaging

## 2023-09-11 ENCOUNTER — Telehealth: Payer: Self-pay | Admitting: Diagnostic Neuroimaging

## 2023-09-11 VITALS — BP 153/74 | HR 64 | Ht 59.0 in | Wt 149.2 lb

## 2023-09-11 DIAGNOSIS — R413 Other amnesia: Secondary | ICD-10-CM

## 2023-09-11 NOTE — Progress Notes (Signed)
 [facility]  PATIENT: Lauren Krueger DOB: 1940-11-14  REFERRING CLINICIAN: Rodrigo Ran, MD HISTORY FROM: PATIENT  REASON FOR VISIT: NEW CONSULT   HISTORICAL  CHIEF COMPLAINT:  Chief Complaint  Patient presents with   New Patient (Initial Visit)    Pt in room 6. Husband in room. New patient paper referral for memory loss. Pt said memory is fine, husband said short memory is concerning.  Pt lives at [facility].     HISTORY OF PRESENT ILLNESS:   83 year old female here for evaluation of memory loss.  Patient denies any memory loss problems.  Husband notes some short-term memory loss issues for the past 1 year.  Patient may repeat herself several times within a short timeframe.  Family history of stroke and dementia in parents. Patient went to PCP for evaluation and had elevated [id number] testing result.  Patient has been live at [facility].  She still takes care of her own personal ADLs, including medications, finances, driving.   REVIEW OF SYSTEMS: Full 14 system review of systems performed and negative with exception of: as per HPI.  ALLERGIES: Allergies  Allergen Reactions   Cefdinir Other (See Comments)   Metronidazole Other (See Comments)    HOME MEDICATIONS: Outpatient Medications Prior to Visit  Medication Sig Dispense Refill   amLODipine (NORVASC) 5 MG tablet Take 2.5 mg by mouth daily.     doxazosin (CARDURA) 2 MG tablet Take 2 mg by mouth daily.     irbesartan (AVAPRO) 300 MG tablet Take 300 mg by mouth daily.     levothyroxine (SYNTHROID) 88 MCG tablet Take 100 mcg by mouth daily.     atorvastatin (LIPITOR) 10 MG tablet Take 10 mg by mouth every 3 (three) days. (Patient not taking: Reported on 09/11/2023)     COVID-19 mRNA bivalent vaccine, Moderna, (MODERNA COVID-19 BIVAL BOOSTER) 50 MCG/0.5ML injection Inject into the muscle. (Patient not taking: Reported on 09/11/2023) 0.5 mL 0   COVID-19 mRNA vaccine,  Moderna, 100 MCG/0.5ML injection Inject into the muscle. (Patient not taking: Reported on 09/11/2023) 0.25 mL 0   No facility-administered medications prior to visit.    PAST MEDICAL HISTORY: Past Medical History:  Diagnosis Date   Hypertension    Thyroid disease     PAST SURGICAL HISTORY: Past Surgical History:  Procedure Laterality Date   HIP ARTHROPLASTY Bilateral     FAMILY HISTORY: History reviewed. No pertinent family history.  SOCIAL HISTORY: Social History   Socioeconomic History   Marital status: Married    Spouse name: Not on file   Number of children: 2   Years of education: Not on file   Highest education level: Not on file  Occupational History   Occupation: Housewife  Tobacco Use   Smoking status: Former    Current packs/day: 1.00    Average packs/day: 1 pack/day for 20.0 years (20.0 ttl pk-yrs)    Types: Cigarettes   Smokeless tobacco: Never  Vaping Use   Vaping status: Never Used  Substance and Sexual Activity   Alcohol use: Yes    Alcohol/week: 1.0 standard drink of alcohol    Types: 1 Shots of liquor per week    Comment: occassional   Drug use: Never   Sexual activity: Not Currently  Other Topics Concern   Not on file  Social History Narrative   Right handed    Wear glasses (readers)   Drinks hot chocolate       Lives [facility]  with husband    Social Drivers of Corporate investment banker Strain: Not on file  Food Insecurity: Not on file  Transportation Needs: Not on file  Physical Activity: Not on file  Stress: Not on file  Social Connections: Not on file  Intimate Partner Violence: Not on file     PHYSICAL EXAM  GENERAL EXAM/CONSTITUTIONAL: Vitals:  Vitals:   09/11/23 0851  BP: (!) 153/74  Pulse: 64  Weight: 149 lb 3.2 oz (67.7 kg)  Height: 4\' 11"  (1.499 m)   Body mass index is 30.13 kg/m. Wt Readings from Last 3 Encounters:  09/11/23 149 lb 3.2 oz (67.7 kg)  04/26/21 136 lb 9.6 oz (62 kg)    Patient is in no distress; well developed, nourished and groomed; neck is supple  CARDIOVASCULAR: Examination of carotid arteries is normal; no carotid bruits Regular rate and rhythm, no murmurs Examination of peripheral vascular system by observation and palpation is normal  EYES: Ophthalmoscopic exam of optic discs and posterior segments is normal; no papilledema or hemorrhages No results found.  MUSCULOSKELETAL: Gait, strength, tone, movements noted in Neurologic exam below  NEUROLOGIC: MENTAL STATUS:      No data to display            09/11/2023    9:02 AM  Montreal Cognitive Assessment   Visuospatial/ Executive (0/5) 4  Naming (0/3) 3  Attention: Read list of digits (0/2) 2  Attention: Read list of letters (0/1) 1  Attention: Serial 7 subtraction starting at 100 (0/3) 2  Language: Repeat phrase (0/2) 2  Language : Fluency (0/1) 1  Abstraction (0/2) 2  Delayed Recall (0/5) 0  Orientation (0/6) 6  Total 23    awake, alert, oriented to person, place and time recent and remote memory intact normal attention and concentration language fluent, comprehension intact, naming intact fund of knowledge appropriate  CRANIAL NERVE:  2nd - no papilledema on fundoscopic exam 2nd, 3rd, 4th, 6th - pupils equal and reactive to light, visual fields full to confrontation, extraocular muscles intact, no nystagmus 5th - facial sensation symmetric 7th - facial strength symmetric 8th - hearing intact 9th - palate elevates symmetrically, uvula midline 11th - shoulder shrug symmetric 12th - tongue protrusion midline  MOTOR:  normal bulk and tone, full strength in the BUE, BLE  SENSORY:  normal and symmetric to light touch, temperature, vibration  COORDINATION:  finger-nose-finger, fine finger movements normal  REFLEXES:  deep tendon reflexes TRACE and symmetric  GAIT/STATION:  narrow based gait     DIAGNOSTIC DATA (LABS, IMAGING, TESTING) - I reviewed patient  records, labs, notes, testing and imaging myself where available.  No results found for: "WBC", "HGB", "HCT", "MCV", "PLT" No results found for: "NA", "K", "CL", "CO2", "GLUCOSE", "BUN", "CREATININE", "CALCIUM", "PROT", "ALBUMIN", "AST", "ALT", "ALKPHOS", "BILITOT", "GFRNONAA", "GFRAA" No results found for: "CHOL", "HDL", "LDLCALC", "LDLDIRECT", "TRIG", "CHOLHDL" No results found for: "HGBA1C" No results found for: "VITAMINB12" No results found for: "TSH"    ASSESSMENT AND PLAN  83 y.o. year old female here with:   Dx:  1. Memory loss     PLAN:  MILD MEMORY LOSS (mild cognitive impairment; MoCA 23/30; no major changes in ADLs) - check MRI brain, B12, ATN panel, hearing testing - discussed amyloid targeting therapies (risks and benefits); patient would like to hold off for now - consider memantine 10mg  at bedtime; increase to twice a day after 1-2 weeks - try to stay active physically and get some exercise (at  least 15-30 minutes per day) - eat a nutritious diet with lean protein, plants / vegetables, whole grains; avoid ultra-processed foods - increase social activities, brain stimulation, games, puzzles, hobbies, crafts, arts, music; try new activities; keep it fun! - aim for at least 7-8 hours sleep per night (or more) - avoid smoking and alcohol - caution with medications, finances, driving - safety / supervision issues reviewed - caregiver resources provided (including [email or web address])  Orders Placed This Encounter  Procedures   MR BRAIN WO CONTRAST   ATN PROFILE   Vitamin B12   Return for pending test results, pending if symptoms worsen or fail to improve.    Suanne Marker, MD 09/11/2023, 10:24 AM Certified in Neurology, Neurophysiology and Neuroimaging  [facility] [address], [address] [place], [place] [address] [phone]

## 2023-09-11 NOTE — Patient Instructions (Addendum)
  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 - aim for at least 7-8 hours sleep per night (or more) - avoid smoking and alcohol - caution with medications, finances, driving - safety / supervision issues reviewed - caregiver resources provided (including [email or web address])

## 2023-09-11 NOTE — Telephone Encounter (Signed)
 no auth required sent to [facility] [phone]

## 2023-09-14 LAB — ATN PROFILE
A -- Beta-amyloid 42/40 Ratio: 0.094 — ABNORMAL LOW (ref >0.102)
Beta-amyloid 40: 111.55 pg/mL
Beta-amyloid 42: 10.48 pg/mL
N -- NfL, Plasma: 4.71 pg/mL (ref 0.00–11.55)
T -- p-tau181: 1.37 pg/mL — ABNORMAL HIGH (ref 0.00–0.97)

## 2023-09-14 LAB — VITAMIN B12: Vitamin B-12: 604 pg/mL (ref 232–1245)

## 2023-09-14 NOTE — Progress Notes (Signed)
 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 Continue current plan. -VRP

## 2023-09-21 ENCOUNTER — Other Ambulatory Visit (HOSPITAL_COMMUNITY): Payer: Self-pay | Admitting: *Deleted

## 2023-09-22 ENCOUNTER — Ambulatory Visit (HOSPITAL_COMMUNITY)
Admission: RE | Admit: 2023-09-22 | Discharge: 2023-09-22 | Disposition: A | Discharge status: Home / Self Care | Source: Ambulatory Visit | Attending: Internal Medicine | Admitting: Internal Medicine

## 2023-09-22 DIAGNOSIS — M810 Age-related osteoporosis without current pathological fracture: Secondary | ICD-10-CM | POA: Insufficient documentation

## 2023-09-22 MED ORDER — DENOSUMAB 60 MG/ML ~~LOC~~ SOSY
PREFILLED_SYRINGE | SUBCUTANEOUS | Status: AC
  Filled 2023-09-22: qty 1

## 2023-09-22 MED ORDER — DENOSUMAB 60 MG/ML ~~LOC~~ SOSY
60.0000 mg | PREFILLED_SYRINGE | Freq: Once | SUBCUTANEOUS | Status: AC
  Administered 2023-09-22: 60 mg via SUBCUTANEOUS

## 2023-09-25 DIAGNOSIS — R4181 Age-related cognitive decline: Secondary | ICD-10-CM | POA: Diagnosis not present

## 2023-09-25 DIAGNOSIS — I10 Essential (primary) hypertension: Secondary | ICD-10-CM | POA: Diagnosis not present

## 2023-09-25 DIAGNOSIS — R413 Other amnesia: Secondary | ICD-10-CM | POA: Diagnosis not present

## 2023-09-26 ENCOUNTER — Ambulatory Visit
Admission: RE | Admit: 2023-09-26 | Discharge: 2023-09-26 | Disposition: A | Discharge status: Home / Self Care | Payer: Medicare Other | Source: Ambulatory Visit | Attending: Diagnostic Neuroimaging | Admitting: Diagnostic Neuroimaging

## 2023-09-26 DIAGNOSIS — R413 Other amnesia: Secondary | ICD-10-CM | POA: Diagnosis not present

## 2023-09-27 NOTE — Progress Notes (Signed)
 Results are good, no major findings.  Minor changes of small vessel disease and atrophy noted.  Continue current plan. -VRP

## 2023-10-11 ENCOUNTER — Ambulatory Visit: Payer: Medicare Other | Attending: Diagnostic Neuroimaging | Admitting: Audiologist

## 2023-10-11 DIAGNOSIS — H903 Sensorineural hearing loss, bilateral: Secondary | ICD-10-CM | POA: Diagnosis not present

## 2023-10-11 NOTE — Procedures (Signed)
  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 Marjory Lies. She was accompanied by her husband who did not provide case history. The patient stated she was unsure of the reason for the referral. [name] denies ear pain, pressure, or tinnitus.  Referred due to cognitive decline and memory loss concerns.  [name] strongly denies any difficulty hearing.   Evaluation:  Otoscopy showed non-occluding cerumen, bilaterally Tympanometry results were consistent with normal middle ear function and Type A tympanograms. Audiometric testing was completed using Conventional Audiometry techniques with insert earphones and supraural headphones. Test results are consistent with a mild sloping to severe high frequency sensorineural hearing loss bilaterally. Speech Recognition Thresholds were obtained at 30 dB HL in the right ear and at 25  dB HL in the left ear. Word Recognition Testing was completed at  40dB SL and [name] scored 96% correct in the right ear and 100% correct in the left ear.   Results:  The test results were reviewed with [name] and husband. [name] has a normal sloping to moderately severe sensorineural  hearing loss bilaterally. She is a hearing aid candidate. Role of hearing loss and cognitive change progression reviewed with her and spouse. [name] was clear she is not interested in hearing aids at this time. She was given a list of local providers in case she changes her mind. Husband said they will talk to neurology about what [name] should do.  Audiogram printed  and provided to [name].    Recommendations: Hearing aids highly recommended for both ears due to cognitive changes. Patient given list of local hearing aid providers.  Annual audiometric testing recommended to monitor hearing loss for progression.    Approximately 35 minutes spent testing and counseling on results.   If you have any questions please feel free to contact me at [phone].  Hanley Ben AuD  Audiologist   Annette Liotta Stalnaker Au.D.  Audiologist   10/11/2023  2:13 PM  Cc: Suanne Marker, MD

## 2023-11-01 DIAGNOSIS — L820 Inflamed seborrheic keratosis: Secondary | ICD-10-CM | POA: Diagnosis not present

## 2023-11-01 DIAGNOSIS — L853 Xerosis cutis: Secondary | ICD-10-CM | POA: Diagnosis not present

## 2023-11-01 DIAGNOSIS — L814 Other melanin hyperpigmentation: Secondary | ICD-10-CM | POA: Diagnosis not present

## 2023-11-01 DIAGNOSIS — L821 Other seborrheic keratosis: Secondary | ICD-10-CM | POA: Diagnosis not present

## 2023-11-09 DIAGNOSIS — H52203 Unspecified astigmatism, bilateral: Secondary | ICD-10-CM | POA: Diagnosis not present

## 2023-11-09 DIAGNOSIS — Z961 Presence of intraocular lens: Secondary | ICD-10-CM | POA: Diagnosis not present

## 2023-11-09 DIAGNOSIS — H04123 Dry eye syndrome of bilateral lacrimal glands: Secondary | ICD-10-CM | POA: Diagnosis not present

## 2023-11-23 ENCOUNTER — Encounter (HOSPITAL_COMMUNITY): Payer: Medicare Other

## 2023-11-24 ENCOUNTER — Other Ambulatory Visit (HOSPITAL_COMMUNITY): Payer: Self-pay | Admitting: *Deleted

## 2023-11-27 ENCOUNTER — Ambulatory Visit (HOSPITAL_COMMUNITY)
Admission: RE | Admit: 2023-11-27 | Discharge: 2023-11-27 | Disposition: A | Discharge status: Home / Self Care | Payer: Medicare Other | Source: Ambulatory Visit | Attending: Internal Medicine | Admitting: Internal Medicine

## 2023-11-27 DIAGNOSIS — M810 Age-related osteoporosis without current pathological fracture: Secondary | ICD-10-CM | POA: Diagnosis not present

## 2023-11-27 MED ORDER — INCLISIRAN SODIUM 284 MG/1.5ML ~~LOC~~ SOSY
284.0000 mg | PREFILLED_SYRINGE | Freq: Once | SUBCUTANEOUS | Status: AC
  Administered 2023-11-27: 284 mg via SUBCUTANEOUS

## 2023-11-27 MED ORDER — INCLISIRAN SODIUM 284 MG/1.5ML ~~LOC~~ SOSY
PREFILLED_SYRINGE | SUBCUTANEOUS | Status: AC
  Filled 2023-11-27: qty 1.5

## 2023-11-30 DIAGNOSIS — N939 Abnormal uterine and vaginal bleeding, unspecified: Secondary | ICD-10-CM | POA: Diagnosis not present

## 2023-11-30 DIAGNOSIS — R4181 Age-related cognitive decline: Secondary | ICD-10-CM | POA: Diagnosis not present

## 2023-11-30 DIAGNOSIS — N898 Other specified noninflammatory disorders of vagina: Secondary | ICD-10-CM | POA: Diagnosis not present

## 2023-11-30 DIAGNOSIS — R310 Gross hematuria: Secondary | ICD-10-CM | POA: Diagnosis not present

## 2023-12-12 DIAGNOSIS — Z1231 Encounter for screening mammogram for malignant neoplasm of breast: Secondary | ICD-10-CM | POA: Diagnosis not present

## 2024-01-18 ENCOUNTER — Other Ambulatory Visit (HOSPITAL_COMMUNITY)
Admission: RE | Admit: 2024-01-18 | Discharge: 2024-01-18 | Disposition: A | Discharge status: Home / Self Care | Source: Ambulatory Visit | Attending: Obstetrics and Gynecology | Admitting: Obstetrics and Gynecology

## 2024-01-18 ENCOUNTER — Ambulatory Visit (INDEPENDENT_AMBULATORY_CARE_PROVIDER_SITE_OTHER): Admitting: Obstetrics and Gynecology

## 2024-01-18 ENCOUNTER — Encounter: Payer: Self-pay | Admitting: Obstetrics and Gynecology

## 2024-01-18 VITALS — BP 128/84 | HR 65

## 2024-01-18 DIAGNOSIS — Z1151 Encounter for screening for human papillomavirus (HPV): Secondary | ICD-10-CM | POA: Insufficient documentation

## 2024-01-18 DIAGNOSIS — K625 Hemorrhage of anus and rectum: Secondary | ICD-10-CM | POA: Diagnosis not present

## 2024-01-18 DIAGNOSIS — N920 Excessive and frequent menstruation with regular cycle: Secondary | ICD-10-CM | POA: Insufficient documentation

## 2024-01-18 DIAGNOSIS — Z01411 Encounter for gynecological examination (general) (routine) with abnormal findings: Secondary | ICD-10-CM | POA: Diagnosis not present

## 2024-01-18 DIAGNOSIS — N898 Other specified noninflammatory disorders of vagina: Secondary | ICD-10-CM | POA: Diagnosis not present

## 2024-01-18 NOTE — Progress Notes (Signed)
   Acute Office Visit  Subjective:    Patient ID: Lauren Krueger, female    DOB: 1940-07-28, 83 y.o.   MRN: [id number]   HPI 83 y.o. presents today for Office Visit (Acute new gyn- referral /Vaginal wall mass & vaginal bleeding - Bled one time May 15th, has only happened the one time. ) . Lives in [place]. Here for consult for one episode on 5/15 after abowel movement of brb with wiping ScreeningUTD with primary in [place] and does prolia  and bone scans there. Colonoscopy 2019  with cologuard  Denies any abnormal pap smears. Last pap smear several years ago. Denies any abnormal discharge Provider in [place] noted a cyst that was not painful or mass in the vagina. Noted some fluid after exam.  No LMP recorded. Patient is postmenopausal.    Review of Systems     Objective:    OBGyn Exam  BP 128/84 (BP Location: Right Arm, Patient Position: Sitting)   Pulse 65   SpO2 94%  Wt Readings from Last 3 Encounters:  09/11/23 149 lb 3.2 oz (67.7 kg)  04/26/21 136 lb 9.6 oz (62 kg)      SVE: atrophic mucosa, no lesions, pap smear and nu swab collecter, with patient permission. No vaginal bleeding seen  [place] was present for the exam  Assessment & Plan:  Spotting x1 after BM on 5/15 Referral for colonoscopy to be thorough and rule out colon cancer as cause and or hemorrhoids To get PUS in [place] to evaluate uterus and ovaries Pap and cultures collected today  Almarie MARLA Carpen

## 2024-01-22 DIAGNOSIS — N898 Other specified noninflammatory disorders of vagina: Secondary | ICD-10-CM | POA: Diagnosis not present

## 2024-01-22 DIAGNOSIS — N920 Excessive and frequent menstruation with regular cycle: Secondary | ICD-10-CM | POA: Diagnosis not present

## 2024-01-22 NOTE — Addendum Note (Signed)
 Addended by: GLENNON ALMARIE POUR on: 01/22/2024 12:41 PM   Modules accepted: Orders

## 2024-01-23 ENCOUNTER — Telehealth: Payer: Self-pay

## 2024-01-23 LAB — SURESWAB® ADVANCED VAGINITIS PLUS,TMA
C. trachomatis RNA, TMA: NOT DETECTED
CANDIDA SPECIES: NOT DETECTED
Candida glabrata: NOT DETECTED
N. gonorrhoeae RNA, TMA: NOT DETECTED
SURESWAB(R) ADV BACTERIAL VAGINOSIS(BV),TMA: NEGATIVE
TRICHOMONAS VAGINALIS (TV),TMA: NOT DETECTED

## 2024-01-23 NOTE — Telephone Encounter (Signed)
 Order form faxed to [facility] for pelvic u/s. Patient is aware. She will call us  if she does not hear from them by the end of next week.  Patient states she lives in [place] during the summer, which is why u/s will be done there.

## 2024-01-23 NOTE — Telephone Encounter (Signed)
-----   Message from Almarie MARLA Carpen sent at 01/18/2024  3:17 PM EDT ----- Can anyone help to get a PUS in [place] [facility] To evaluate uterus and ovaries for PM spotting.  Dr. Carpen

## 2024-01-24 ENCOUNTER — Ambulatory Visit: Payer: Self-pay | Admitting: Obstetrics and Gynecology

## 2024-01-26 LAB — CYTOLOGY - PAP: Diagnosis: NEGATIVE

## 2024-02-13 DIAGNOSIS — N950 Postmenopausal bleeding: Secondary | ICD-10-CM | POA: Diagnosis not present

## 2024-03-06 ENCOUNTER — Telehealth (HOSPITAL_COMMUNITY): Payer: Self-pay | Admitting: Pharmacy Technician

## 2024-03-06 NOTE — Telephone Encounter (Signed)
 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 Med will be covered at 100%.   Ania Levay, CPhT [facility] Phone: [phone] 03/06/2024

## 2024-03-21 ENCOUNTER — Other Ambulatory Visit (HOSPITAL_COMMUNITY): Payer: Self-pay | Admitting: *Deleted

## 2024-03-25 ENCOUNTER — Ambulatory Visit (HOSPITAL_COMMUNITY)
Admission: RE | Admit: 2024-03-25 | Discharge: 2024-03-25 | Disposition: A | Discharge status: Home / Self Care | Source: Ambulatory Visit | Attending: Internal Medicine | Admitting: Internal Medicine

## 2024-03-25 DIAGNOSIS — M810 Age-related osteoporosis without current pathological fracture: Secondary | ICD-10-CM | POA: Insufficient documentation

## 2024-03-25 MED ORDER — DENOSUMAB 60 MG/ML ~~LOC~~ SOSY
PREFILLED_SYRINGE | SUBCUTANEOUS | Status: AC
  Filled 2024-03-25: qty 1

## 2024-03-25 MED ORDER — DENOSUMAB 60 MG/ML ~~LOC~~ SOSY
60.0000 mg | PREFILLED_SYRINGE | Freq: Once | SUBCUTANEOUS | Status: AC
  Administered 2024-03-25: 60 mg via SUBCUTANEOUS

## 2024-04-01 ENCOUNTER — Other Ambulatory Visit (HOSPITAL_COMMUNITY): Payer: Self-pay | Admitting: Internal Medicine

## 2024-04-01 ENCOUNTER — Telehealth (HOSPITAL_COMMUNITY): Payer: Self-pay | Admitting: Pharmacy Technician

## 2024-04-01 DIAGNOSIS — E785 Hyperlipidemia, unspecified: Secondary | ICD-10-CM | POA: Insufficient documentation

## 2024-04-01 DIAGNOSIS — M810 Age-related osteoporosis without current pathological fracture: Secondary | ICD-10-CM | POA: Insufficient documentation

## 2024-04-01 NOTE — Telephone Encounter (Signed)
 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

## 2024-04-08 DIAGNOSIS — K589 Irritable bowel syndrome without diarrhea: Secondary | ICD-10-CM | POA: Diagnosis not present

## 2024-04-08 DIAGNOSIS — I422 Other hypertrophic cardiomyopathy: Secondary | ICD-10-CM | POA: Diagnosis not present

## 2024-04-08 DIAGNOSIS — I10 Essential (primary) hypertension: Secondary | ICD-10-CM | POA: Diagnosis not present

## 2024-04-08 DIAGNOSIS — E785 Hyperlipidemia, unspecified: Secondary | ICD-10-CM | POA: Diagnosis not present

## 2024-04-08 DIAGNOSIS — M79672 Pain in left foot: Secondary | ICD-10-CM | POA: Diagnosis not present

## 2024-04-08 DIAGNOSIS — H3530 Unspecified macular degeneration: Secondary | ICD-10-CM | POA: Diagnosis not present

## 2024-04-08 DIAGNOSIS — G252 Other specified forms of tremor: Secondary | ICD-10-CM | POA: Diagnosis not present

## 2024-04-08 DIAGNOSIS — I2510 Atherosclerotic heart disease of native coronary artery without angina pectoris: Secondary | ICD-10-CM | POA: Diagnosis not present

## 2024-04-08 DIAGNOSIS — Z23 Encounter for immunization: Secondary | ICD-10-CM | POA: Diagnosis not present

## 2024-04-08 DIAGNOSIS — E039 Hypothyroidism, unspecified: Secondary | ICD-10-CM | POA: Diagnosis not present

## 2024-04-08 DIAGNOSIS — R4181 Age-related cognitive decline: Secondary | ICD-10-CM | POA: Diagnosis not present

## 2024-04-08 DIAGNOSIS — M810 Age-related osteoporosis without current pathological fracture: Secondary | ICD-10-CM | POA: Diagnosis not present

## 2024-04-15 ENCOUNTER — Other Ambulatory Visit (HOSPITAL_COMMUNITY): Payer: Self-pay | Admitting: Pharmacy Technician

## 2024-04-29 DIAGNOSIS — M79671 Pain in right foot: Secondary | ICD-10-CM | POA: Diagnosis not present

## 2024-04-29 DIAGNOSIS — G609 Hereditary and idiopathic neuropathy, unspecified: Secondary | ICD-10-CM | POA: Diagnosis not present

## 2024-04-29 DIAGNOSIS — M79672 Pain in left foot: Secondary | ICD-10-CM | POA: Diagnosis not present

## 2024-05-27 ENCOUNTER — Other Ambulatory Visit (HOSPITAL_COMMUNITY): Payer: Self-pay | Admitting: Internal Medicine

## 2024-05-29 ENCOUNTER — Ambulatory Visit (HOSPITAL_COMMUNITY)
Admission: RE | Admit: 2024-05-29 | Discharge: 2024-05-29 | Disposition: A | Discharge status: Home / Self Care | Source: Ambulatory Visit | Attending: Internal Medicine | Admitting: Internal Medicine

## 2024-05-29 VITALS — BP 145/69 | HR 58 | Temp 97.2°F | Resp 16

## 2024-05-29 DIAGNOSIS — E785 Hyperlipidemia, unspecified: Secondary | ICD-10-CM | POA: Diagnosis not present

## 2024-05-29 MED ORDER — INCLISIRAN SODIUM 284 MG/1.5ML ~~LOC~~ SOSY
PREFILLED_SYRINGE | SUBCUTANEOUS | Status: AC
  Filled 2024-05-29: qty 1.5

## 2024-05-29 MED ORDER — INCLISIRAN SODIUM 284 MG/1.5ML ~~LOC~~ SOSY
284.0000 mg | PREFILLED_SYRINGE | Freq: Once | SUBCUTANEOUS | Status: AC
  Administered 2024-05-29: 284 mg via SUBCUTANEOUS

## 2024-06-05 DIAGNOSIS — L814 Other melanin hyperpigmentation: Secondary | ICD-10-CM | POA: Diagnosis not present

## 2024-06-05 DIAGNOSIS — L821 Other seborrheic keratosis: Secondary | ICD-10-CM | POA: Diagnosis not present

## 2024-06-19 ENCOUNTER — Other Ambulatory Visit (HOSPITAL_BASED_OUTPATIENT_CLINIC_OR_DEPARTMENT_OTHER): Payer: Self-pay

## 2024-06-19 DIAGNOSIS — Z23 Encounter for immunization: Secondary | ICD-10-CM | POA: Diagnosis not present

## 2024-06-19 MED ORDER — COMIRNATY 30 MCG/0.3ML IM SUSY
0.3000 mL | PREFILLED_SYRINGE | Freq: Once | INTRAMUSCULAR | 0 refills | Status: AC
  Filled 2024-06-19: qty 0.3, 1d supply, fill #0

## 2024-09-23 ENCOUNTER — Encounter (HOSPITAL_COMMUNITY)

## 2024-11-27 ENCOUNTER — Encounter (HOSPITAL_COMMUNITY)
